# Patient Record
Sex: Female | Born: 1945 | Race: White | Hispanic: No | Marital: Single | Smoking: Former smoker
Health system: Southern US, Community
[De-identification: ages and names within clinical notes are randomized; demographics above are authoritative.]

## PROBLEM LIST (undated history)

## (undated) DIAGNOSIS — I2510 Atherosclerotic heart disease of native coronary artery without angina pectoris: Secondary | ICD-10-CM

## (undated) DIAGNOSIS — F419 Anxiety disorder, unspecified: Secondary | ICD-10-CM

## (undated) DIAGNOSIS — I739 Peripheral vascular disease, unspecified: Secondary | ICD-10-CM

## (undated) DIAGNOSIS — N200 Calculus of kidney: Secondary | ICD-10-CM

## (undated) DIAGNOSIS — F411 Generalized anxiety disorder: Secondary | ICD-10-CM

## (undated) DIAGNOSIS — E785 Hyperlipidemia, unspecified: Secondary | ICD-10-CM

## (undated) DIAGNOSIS — I10 Essential (primary) hypertension: Secondary | ICD-10-CM

## (undated) HISTORY — DX: Hyperlipidemia, unspecified: E78.5

## (undated) HISTORY — DX: Peripheral vascular disease, unspecified: I73.9

## (undated) HISTORY — DX: Atherosclerotic heart disease of native coronary artery without angina pectoris: I25.10

## (undated) HISTORY — PX: CORONARY ARTERY BYPASS GRAFT: SHX141

## (undated) HISTORY — DX: Calculus of kidney: N20.0

## (undated) HISTORY — DX: Generalized anxiety disorder: F41.1

---

## 2009-05-04 ENCOUNTER — Inpatient Hospital Stay: Payer: Self-pay | Admitting: Internal Medicine

## 2010-05-02 ENCOUNTER — Ambulatory Visit: Payer: Self-pay | Admitting: Internal Medicine

## 2010-05-13 ENCOUNTER — Ambulatory Visit: Payer: Self-pay | Admitting: Internal Medicine

## 2010-06-17 ENCOUNTER — Ambulatory Visit
Admission: RE | Admit: 2010-06-17 | Discharge: 2010-06-17 | Payer: Self-pay | Source: Home / Self Care | Attending: Family Medicine | Admitting: Family Medicine

## 2010-06-17 DIAGNOSIS — I10 Essential (primary) hypertension: Secondary | ICD-10-CM | POA: Insufficient documentation

## 2010-06-17 DIAGNOSIS — I2510 Atherosclerotic heart disease of native coronary artery without angina pectoris: Secondary | ICD-10-CM | POA: Insufficient documentation

## 2010-06-17 LAB — CONVERTED CEMR LAB
Ketones, urine, test strip: NEGATIVE
Protein, U semiquant: NEGATIVE
Specific Gravity, Urine: 1.015
Urobilinogen, UA: 0.2
pH: 5.5

## 2010-06-18 ENCOUNTER — Encounter: Payer: Self-pay | Admitting: Family Medicine

## 2010-06-28 ENCOUNTER — Ambulatory Visit: Payer: Self-pay | Admitting: Unknown Physician Specialty

## 2010-07-04 NOTE — Assessment & Plan Note (Signed)
Summary: back pain/jbb   Vital Signs:  Patient profile:   65 year old female Height:      64.25 inches Weight:      227 pounds BMI:     38.80 O2 Sat:      99 % on Room air Temp:     97.1 degrees F oral Pulse rate:   58 / minute Pulse rhythm:   regular Resp:     18 per minute BP sitting:   185 / 84  (right arm)  Vitals Entered By: Levonne Spiller EMT-P (June 17, 2010 4:16 PM)  O2 Flow:  Room air CC: Lower Back Pain Is Patient Diabetic? No Pain Assessment Patient in pain? yes     Location: lower back Intensity: 5 Type: aching Onset of pain  Gradual  Does patient need assistance? Functional Status Self care Ambulation Normal   Chief Complaint:  Lower Back Pain.  History of Present Illness: The patient presented today because for the past 3 days she has been having some lower back pain especially with bending over and picking up articles of clothing, etc.  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  Pt says she has not lost control of her bowel or bladder function.  She says that she is not having blood in urine or dysuria.  She says that she has been feeling a tightness and aching sensation in the muscles of the lumbar spine.  Pt says that she has not taken any medications at this time.  Pt says she has been working and in pain all day and this is the reason her BP has been elevated.  Pt says that she had her BP tested several days ago and it was 130/60.  PT denies headache, CP, SOB, nausea, weakness, etc.   Allergies (verified): No Known Drug Allergies  Past History:  Past Medical History: HTN CAD s/p CABG HLE  Past Surgical History: CABG x4  Family History: Denies significant per pt.   Social History: Pt works in [employer] full time.  No ETOH, tobacco or recreational drug use. Helping to raise great grandchild.   Review of Systems General:  Denies chills, fatigue, fever, loss of appetite, malaise, sleep disorder, sweats,  weakness, and weight loss. Eyes:  Denies blurring, discharge, double vision, eye irritation, eye pain, halos, itching, light sensitivity, red eye, vision loss-1 eye, and vision loss-both eyes. ENT:  Denies decreased hearing, difficulty swallowing, ear discharge, earache, hoarseness, nasal congestion, nosebleeds, postnasal drainage, ringing in ears, sinus pressure, and sore throat. CV:  Denies bluish discoloration of lips or nails, chest pain or discomfort, difficulty breathing at night, difficulty breathing while lying down, fainting, fatigue, leg cramps with exertion, lightheadness, near fainting, palpitations, shortness of breath with exertion, swelling of feet, swelling of hands, and weight gain. Resp:  Denies chest discomfort, chest pain with inspiration, cough, coughing up blood, excessive snoring, hypersomnolence, morning headaches, pleuritic, shortness of breath, sputum productive, and wheezing. GI:  Denies abdominal pain, bloody stools, change in bowel habits, constipation, dark tarry stools, diarrhea, excessive appetite, gas, hemorrhoids, indigestion, loss of appetite, nausea, vomiting, vomiting blood, and yellowish skin color. GU:  Denies abnormal vaginal bleeding, decreased libido, discharge, dysuria, genital sores, hematuria, incontinence, nocturia, urinary frequency, and urinary hesitancy. MS:  Complains of low back pain, muscle aches, and stiffness; Lower Back Pain, No injury Noted.. Derm:  Denies changes in color of skin, changes in nail beds, dryness, excessive perspiration, flushing, hair  loss, insect bite(s), itching, lesion(s), poor wound healing, and rash. Neuro:  Denies brief paralysis, difficulty with concentration, disturbances in coordination, falling down, headaches, inability to speak, memory loss, numbness, poor balance, seizures, sensation of room spinning, tingling, tremors, visual disturbances, and weakness. Psych:  Denies alternate hallucination ( auditory/visual), anxiety,  depression, easily angered, easily tearful, irritability, mental problems, panic attacks, sense of great danger, suicidal thoughts/plans, thoughts of violence, unusual visions or sounds, and thoughts /plans of harming others. Endo:  Denies cold intolerance, excessive hunger, excessive thirst, excessive urination, heat intolerance, polyuria, and weight change. Heme:  Denies abnormal bruising, bleeding, enlarge lymph nodes, fevers, pallor, and skin discoloration. Allergy:  Denies hives or rash, itching eyes, persistent infections, seasonal allergies, and sneezing.  Physical Exam  General:  Well-developed,well-nourished,in no acute distress; alert,appropriate and cooperative throughout examination Head:  Normocephalic and atraumatic without obvious abnormalities. No apparent alopecia or balding. Eyes:  No corneal or conjunctival inflammation noted. EOMI. Perrla. Funduscopic exam benign, without hemorrhages, exudates or papilledema. Vision grossly normal. Ears:  External ear exam shows no significant lesions or deformities.  Otoscopic examination reveals clear canals, tympanic membranes are intact bilaterally without bulging, retraction, inflammation or discharge. Hearing is grossly normal bilaterally. Nose:  External nasal examination shows no deformity or inflammation. Nasal mucosa are pink and moist without lesions or exudates. Mouth:  Oral mucosa and oropharynx without lesions or exudates.  Teeth in good repair. Lungs:  Normal respiratory effort, chest expands symmetrically. Lungs are clear to auscultation, no crackles or wheezes. Heart:  Normal rate and regular rhythm. S1 and S2 normal without gallop, murmur, click, rub or other extra sounds. Abdomen:  Bowel sounds positive,abdomen soft and non-tender without masses, organomegaly or hernias noted. Msk:  tenderness in the paraspinal muscles of the lumbar spine.  Negative straight leg raise.  Normal reflexes in the lower extremities.  No strength or  motion deficits noted.  NO herpetic rash seen.   Extremities:  No clubbing, cyanosis, edema, or deformity noted with normal full range of motion of all joints.   Neurologic:  No cranial nerve deficits noted. Station and gait are normal. Plantar reflexes are down-going bilaterally. DTRs are symmetrical throughout. Sensory, motor and coordinative functions appear intact.   Impression & Recommendations:  Problem # 1:  LOW BACK PAIN, ACUTE (ICD-724.2)  Her updated medication list for this problem includes:    Cyclobenzaprine Hcl 5 Mg Tabs (Cyclobenzaprine hcl) .Marland Kitchen... Take 1 by mouth every 8 hours as needed for muscle spasms in back:  caution will cause drowsiness    Lortab 5-500 Mg Tabs (Hydrocodone-acetaminophen) .Marland Kitchen... Take 1 by mouth every 6 hours as needed severe back pain:  will cause drowsiness. Toradol 30 mg IM given in office today.  The risks, benefits and possible side effects were clearly explained and discussed with the patient.  The patient verbalized clear understanding.  The patient was given instructions to return if symptoms don't improve, worsen or new changes develop.  If it is not during clinic hours and the patient cannot get back to this clinic then the patient was told to seek medical care at an available urgent care or emergency department.  The patient verbalized understanding.   If no improvement in next 48 hours patient was instructed to call and we will send her for another xray of L-Spine. The patient verbalized clear understanding.   Pt was given instructions to go to ER if symptoms worsen or don't improve.  The patient verbalized clear understanding.    Orders:  Ketorolac-Toradol 15mg  ([id number])  Problem # 2:  UNSPECIFIED ESSENTIAL HYPERTENSION (ICD-401.9)  Her updated medication list for this problem includes:    Metoprolol Tartrate 25 Mg Tabs (Metoprolol tartrate) .Marland Kitchen... 1/2 tablet twice a day by mouth Rechecked BP in office and improved to 160/85.  Pt given instructions  to have BP rechecked again in 2-3 days and to follow up with her PCP to have her BP meds adjusted if not controlling her BP. The patient verbalized clear understanding.    Complete Medication List: 1)  Metoprolol Tartrate 25 Mg Tabs (Metoprolol tartrate) .... 1/2 tablet twice a day by mouth 2)  Lipitor 20 Mg Tabs (Atorvastatin calcium) .Marland Kitchen.. 1 tablet at bedtime by mouth 3)  Zithromax Z-pak 250 Mg Tabs (Azithromycin) .... 2 by mouth then 1 by mouth qd 4)  Cyclobenzaprine Hcl 5 Mg Tabs (Cyclobenzaprine hcl) .... Take 1 by mouth every 8 hours as needed for muscle spasms in back:  caution will cause drowsiness 5)  Lortab 5-500 Mg Tabs (Hydrocodone-acetaminophen) .... Take 1 by mouth every 6 hours as needed severe back pain:  will cause drowsiness.  Patient Instructions: 1)  Go to the pharmacy and pick up your prescription (s).  It may take up to 30 mins for electronic prescriptions to be delivered to the pharmacy.  Please call if your pharmacy has not received your prescriptions after 30 minutes.   2)  Check your Blood Pressure regularly. If it is above: 140/90 you should make an appointment. 3)  Muscle Relaxers like cyclobenzaprine can cause drowsiness.  Please be careful. 4)  Get your blood pressure rechecked again in 2-3 days. 5)  See your PCP in 1 week to have your blood pressure rechecked and have your meds adjusted if needed.  6)  The patient was informed that there is no on-call provider or services available at this clinic during off-hours (when the clinic is closed).  If the patient developed a problem or concern that required immediate attention, the patient was advised to go the the nearest available urgent care or emergency department for medical care.  The patient verbalized understanding.    7)  The patient's prescriptions were checked for possible interactions and electronically sent to the pharmacy of choice.   Prescriptions: LORTAB 5-500 MG TABS (HYDROCODONE-ACETAMINOPHEN) take 1 by  mouth every 6 hours as needed severe back pain:  Will Cause Drowsiness.  #12 x 0   Entered and Authorized by:   Standley Dakins MD   Signed by:   Standley Dakins MD on 06/17/2010   Method used:   Print then Give to Patient   RxID:   [id number] CYCLOBENZAPRINE HCL 5 MG TABS (CYCLOBENZAPRINE HCL) take 1 by mouth every 8 hours as needed for muscle spasms in back:  Caution Will Cause Drowsiness  #12 x 0   Entered and Authorized by:   Standley Dakins MD   Signed by:   Standley Dakins MD on 06/17/2010   Method used:   Electronically to        [facility] [phone]* (retail)       [address] [place], [place]  [address]       Ph: [phone] or [phone]       Fax: [phone]   RxID:   [id number]    Medication Administration  Injection # 1:    Medication: Ketorolac-Toradol 30mg     Diagnosis: Lower Back Pain  Route: IM    Site: LUOQ gluteus    Exp Date: 05/03/2011    Lot #: [id number]    Patient tolerated injection without complications    Given by: Levonne Spiller EMT-P (June 17, 2010 4:47 PM)  Orders Added: 1)  Ketorolac-Toradol 15mg  [[id number]]  Lab Results    Ordered by:  Dr. Maryln Manuel    Date tests performed: 06/17/2010    Performed by:  Levonne Spiller EMT-P Urinalysis:      Color:     Yellow    Appear:     Clear    Leuk:     Neg    Nitr:     Neg    Urobil:     0.2    Prot:     Neg    pH:     5.5    Blood:     Neg    Sp. Gr:     1.015    Ket:     Neg    Bili:     Neg    Glu:     Neg

## 2010-07-04 NOTE — Assessment & Plan Note (Signed)
Summary: HOARSE AND CHEST CONGESTION/EVM   Vital Signs:  Patient Profile:   65 Years Old Female CC:      Cold & URI symptoms Height:     64.25 inches Weight:      223 pounds BMI:     38.12 O2 Sat:      96 % O2 treatment:    Room Air Temp:     97.3 degrees F oral Pulse rate:   76 / minute Pulse rhythm:   regular Resp:     18 per minute BP sitting:   188 / 86  (right arm)  Pt. in pain?   no  Vitals Entered By: Levonne Spiller EMT-P (May 13, 2010 4:17 PM)              Is Patient Diabetic? No     Serial Vital Signs/Assessments:  Time      Position  BP       Pulse  Resp  Temp     By 1655                129/88                         Ysidro Evert MD   Updated Prior Medication List: * METOPROLOL 1half tab twice per day * LIPITOR 1 tab per day  Current Allergies: No known allergies History of Present Illness Chief Complaint: Cold & URI symptoms x 2 weeks. History of Present Illness: chest tighter but hoarseness better. overall not better or worse. cough is dry, nasal  d/c is clear. no fever, just tired. "sinus" seems to be seasonal.  No over the counter meds   REVIEW OF SYSTEMS Constitutional Symptoms       Complains of fatigue.     Denies fever, chills, night sweats, weight loss, and weight gain.  Eyes       Denies change in vision, eye pain, eye discharge, glasses, contact lenses, and eye surgery. Ear/Nose/Throat/Mouth       Complains of frequent runny nose, sinus problems, sore throat, and hoarseness.      Denies hearing loss/aids, change in hearing, ear pain, ear discharge, dizziness, frequent nose bleeds, and tooth pain or bleeding.      Comments: st gone Respiratory       Complains of dry cough and shortness of breath.      Denies wheezing, asthma, bronchitis, and emphysema/COPD.      Comments: sob is really need to sigh Cardiovascular       Denies murmurs, chest pain, and tires easily with exhertion.    Gastrointestinal       Denies stomach pain,  nausea/vomiting, diarrhea, constipation, blood in bowel movements, and indigestion. Genitourniary       Denies painful urination, kidney stones, and loss of urinary control. Neurological       Denies paralysis, seizures, and fainting/blackouts. Musculoskeletal       Denies muscle pain, joint pain, joint stiffness, decreased range of motion, redness, swelling, muscle weakness, and gout.  Skin       Denies bruising, unusual mles/lumps or sores, and hair/skin or nail changes.  Psych       Denies mood changes, temper/anger issues, anxiety/stress, speech problems, depression, and sleep problems. Physical Exam General appearance: well developed, well nourished, no acute distress, mod hoarse Head: normocephalic, atraumatic Eyes: conjunctivae and lids normal Ears: normal, no lesions or deformities Nasal: mucosa pink, nonedematous, no septal deviation, turbinates normal  Oral/Pharynx: tongue normal, posterior pharynx without erythema or exudate Neck: neck supple,  trachea midline, no masses Chest/Lungs: no rales, wheezes, or rhonchi bilateral, breath sounds equal without effort. midline scar Neurological: grossly intact and non-focal Skin: no obvious rashes or lesions MSE: oriented to time, place, and person Assessment New Problems: UPPER RESPIRATORY INFECTION, ACUTE (ICD-465.9)   Plan New Medications/Changes: ZITHROMAX Z-PAK 250 MG TABS (AZITHROMYCIN) 2 by mouth then 1 by mouth qd  #6 x 0, 05/13/2010, J. Juline Patch MD   The patient and/or caregiver has been counseled thoroughly with regard to medications prescribed including dosage, schedule, interactions, rationale for use, and possible side effects and they verbalize understanding.  Diagnoses and expected course of recovery discussed and will return if not improved as expected or if the condition worsens. Patient and/or caregiver verbalized understanding.  Prescriptions: ZITHROMAX Z-PAK 250 MG TABS (AZITHROMYCIN) 2 by mouth then 1 by  mouth qd  #6 x 0   Entered and Authorized by:   J. Juline Patch MD   Signed by:   Shela Commons. Juline Patch MD on 05/13/2010   Method used:   Print then Give to Patient   RxID:   [id number]   Patient Instructions: 1)  start z pak if worse. 2)  Oral Rehydration Solution: drink 1/2 ounce every 15 minutes. If tolerated afert 1 hour, drink 1 ounce every 15 minutes. As you can tolerate, keep adding 1/2 ounce every 15 minutes, up to a total of 2-4 ounces. Contact the office if unable to tolerate oral solution, if you keep vomiting, or you continue to have signs of dehydration. 3)  own tussionex cough syrup if cough at night. 4)  vit c. 5)  claritin 10 daily 6)  blood pressure checks, see MD if more than 170/110.   7)  Please schedule an appointment with your primary doctor in :  Appended Document: Med/Alg Import     Preload Clinical Lists Medications added:  METOPROLOL TARTRATE 25 MG TABS (METOPROLOL TARTRATE) 1/2 tablet twice a day by mouth LIPITOR 20 MG TABS (ATORVASTATIN CALCIUM) 1 tablet at bedtime by mouth

## 2010-07-05 ENCOUNTER — Ambulatory Visit: Payer: Self-pay | Admitting: Unknown Physician Specialty

## 2011-04-15 ENCOUNTER — Ambulatory Visit: Payer: Self-pay | Admitting: Vascular Surgery

## 2011-04-23 ENCOUNTER — Ambulatory Visit: Payer: Self-pay | Admitting: Vascular Surgery

## 2011-04-30 ENCOUNTER — Ambulatory Visit: Payer: Self-pay | Admitting: Cardiology

## 2011-05-05 ENCOUNTER — Ambulatory Visit: Payer: Self-pay | Admitting: Vascular Surgery

## 2011-06-18 ENCOUNTER — Inpatient Hospital Stay: Payer: Self-pay | Admitting: Vascular Surgery

## 2011-06-19 LAB — CBC WITH DIFFERENTIAL/PLATELET
Basophil #: 0 10*3/uL (ref 0.0–0.1)
Eosinophil #: 0.1 10*3/uL (ref 0.0–0.7)
Lymphocyte #: 1.1 10*3/uL (ref 1.0–3.6)
MCH: 30.5 pg (ref 26.0–34.0)
MCHC: 33.3 g/dL (ref 32.0–36.0)
MCV: 92 fL (ref 80–100)
Monocyte #: 0.8 10*3/uL — ABNORMAL HIGH (ref 0.0–0.7)
Neutrophil %: 70.2 %
Platelet: 279 10*3/uL (ref 150–440)

## 2011-06-19 LAB — BASIC METABOLIC PANEL
Calcium, Total: 8.2 mg/dL — ABNORMAL LOW (ref 8.5–10.1)
Glucose: 106 mg/dL — ABNORMAL HIGH (ref 65–99)
Potassium: 4.4 mmol/L (ref 3.5–5.1)
Sodium: 145 mmol/L (ref 136–145)

## 2011-06-19 LAB — PROTIME-INR
INR: 1
Prothrombin Time: 13.1 secs (ref 11.5–14.7)

## 2012-03-30 ENCOUNTER — Ambulatory Visit: Payer: Self-pay | Admitting: Internal Medicine

## 2013-02-17 ENCOUNTER — Emergency Department: Payer: Self-pay | Admitting: Emergency Medicine

## 2013-02-17 LAB — URINALYSIS, COMPLETE
Blood: NEGATIVE
Glucose,UR: NEGATIVE mg/dL (ref 0–75)
Hyaline Cast: 8
Ketone: NEGATIVE
Nitrite: NEGATIVE
Protein: NEGATIVE
Specific Gravity: 1.017 (ref 1.003–1.030)
WBC UR: NONE SEEN /HPF (ref 0–5)

## 2013-09-09 DIAGNOSIS — E785 Hyperlipidemia, unspecified: Secondary | ICD-10-CM | POA: Insufficient documentation

## 2013-09-09 DIAGNOSIS — I10 Essential (primary) hypertension: Secondary | ICD-10-CM | POA: Insufficient documentation

## 2013-09-09 DIAGNOSIS — I25810 Atherosclerosis of coronary artery bypass graft(s) without angina pectoris: Secondary | ICD-10-CM | POA: Insufficient documentation

## 2013-11-16 DIAGNOSIS — I739 Peripheral vascular disease, unspecified: Secondary | ICD-10-CM | POA: Insufficient documentation

## 2013-11-29 DIAGNOSIS — F411 Generalized anxiety disorder: Secondary | ICD-10-CM | POA: Insufficient documentation

## 2014-03-03 ENCOUNTER — Inpatient Hospital Stay: Payer: Self-pay | Admitting: Internal Medicine

## 2014-03-03 LAB — BASIC METABOLIC PANEL
Anion Gap: 8 (ref 7–16)
BUN: 12 mg/dL (ref 7–18)
CALCIUM: 8.7 mg/dL (ref 8.5–10.1)
CHLORIDE: 109 mmol/L — AB (ref 98–107)
Co2: 25 mmol/L (ref 21–32)
Creatinine: 0.81 mg/dL (ref 0.60–1.30)
EGFR (African American): >60
GLUCOSE: 92 mg/dL (ref 65–99)
Osmolality: 283 (ref 275–301)
Potassium: 3.9 mmol/L (ref 3.5–5.1)
SODIUM: 142 mmol/L (ref 136–145)

## 2014-03-03 LAB — CK TOTAL AND CKMB (NOT AT ARMC)
CK, TOTAL: 122 U/L
CK, Total: 117 U/L
CK, Total: 103 U/L
CK-MB: 3.8 ng/mL — AB (ref 0.5–3.6)
CK-MB: 4.2 ng/mL — ABNORMAL HIGH (ref 0.5–3.6)
CK-MB: 3.4 ng/mL (ref 0.5–3.6)

## 2014-03-03 LAB — PRO B NATRIURETIC PEPTIDE: B-Type Natriuretic Peptide: 4287 pg/mL — ABNORMAL HIGH (ref 0–125)

## 2014-03-03 LAB — CBC
HCT: 38.5 % (ref 35.0–47.0)
HGB: 12.5 g/dL (ref 12.0–16.0)
MCH: 31.4 pg (ref 26.0–34.0)
MCHC: 32.4 g/dL (ref 32.0–36.0)
MCV: 97 fL (ref 80–100)
PLATELETS: 279 10*3/uL (ref 150–440)
RBC: 3.98 10*6/uL (ref 3.80–5.20)
RDW: 13.3 % (ref 11.5–14.5)
WBC: 6.5 10*3/uL (ref 3.6–11.0)

## 2014-03-03 LAB — TROPONIN I
Troponin-I: <0.02 ng/mL
Troponin-I: 0.02 ng/mL
Troponin-I: 0.02 ng/mL

## 2014-03-04 LAB — CBC WITH DIFFERENTIAL/PLATELET
BASOS PCT: 1.1 %
Basophil #: 0.1 10*3/uL (ref 0.0–0.1)
EOS ABS: 0.2 10*3/uL (ref 0.0–0.7)
EOS PCT: 3.3 %
HCT: 42 % (ref 35.0–47.0)
HGB: 13.6 g/dL (ref 12.0–16.0)
LYMPHS PCT: 25.2 %
Lymphocyte #: 1.3 10*3/uL (ref 1.0–3.6)
MCH: 31.2 pg (ref 26.0–34.0)
MCHC: 32.4 g/dL (ref 32.0–36.0)
MCV: 97 fL (ref 80–100)
MONOS PCT: 15.8 %
Monocyte #: 0.8 x10 3/mm (ref 0.2–0.9)
NEUTROS ABS: 2.7 10*3/uL (ref 1.4–6.5)
Neutrophil %: 54.6 %
Platelet: 259 10*3/uL (ref 150–440)
RBC: 4.35 10*6/uL (ref 3.80–5.20)
RDW: 13.3 % (ref 11.5–14.5)
WBC: 5 10*3/uL (ref 3.6–11.0)

## 2014-03-04 LAB — LIPID PANEL
Cholesterol: 223 mg/dL — ABNORMAL HIGH (ref 0–200)
HDL: 49 mg/dL (ref 40–60)
LDL CHOLESTEROL, CALC: 141 mg/dL — AB (ref 0–100)
TRIGLYCERIDES: 163 mg/dL (ref 0–200)
VLDL Cholesterol, Calc: 33 mg/dL (ref 5–40)

## 2014-03-04 LAB — PRO B NATRIURETIC PEPTIDE: B-TYPE NATIURETIC PEPTID: 3170 pg/mL — AB (ref 0–125)

## 2014-03-04 LAB — TSH: THYROID STIMULATING HORM: 1.55 u[IU]/mL

## 2014-05-26 LAB — TROPONIN I: Troponin-I: <0.02 ng/mL

## 2014-05-26 LAB — CBC
HCT: 42.1 % (ref 35.0–47.0)
HGB: 13.8 g/dL (ref 12.0–16.0)
MCH: 31.6 pg (ref 26.0–34.0)
MCHC: 32.8 g/dL (ref 32.0–36.0)
MCV: 96 fL (ref 80–100)
PLATELETS: 334 10*3/uL (ref 150–440)
RBC: 4.37 10*6/uL (ref 3.80–5.20)
RDW: 13.2 % (ref 11.5–14.5)
WBC: 10.4 10*3/uL (ref 3.6–11.0)

## 2014-05-26 LAB — PRO B NATRIURETIC PEPTIDE: B-TYPE NATIURETIC PEPTID: 6827 pg/mL — AB (ref 0–125)

## 2014-05-26 LAB — COMPREHENSIVE METABOLIC PANEL
ANION GAP: 7 (ref 7–16)
Albumin: 3.9 g/dL (ref 3.4–5.0)
Alkaline Phosphatase: 165 U/L — ABNORMAL HIGH
BILIRUBIN TOTAL: 0.4 mg/dL (ref 0.2–1.0)
BUN: 19 mg/dL — ABNORMAL HIGH (ref 7–18)
Calcium, Total: 8.7 mg/dL (ref 8.5–10.1)
Chloride: 105 mmol/L (ref 98–107)
Co2: 26 mmol/L (ref 21–32)
Creatinine: 1.17 mg/dL (ref 0.60–1.30)
EGFR (African American): 59 — ABNORMAL LOW
GFR CALC NON AF AMER: 49 — AB
GLUCOSE: 228 mg/dL — AB (ref 65–99)
Osmolality: 285 (ref 275–301)
Potassium: 3.7 mmol/L (ref 3.5–5.1)
SGOT(AST): 84 U/L — ABNORMAL HIGH (ref 15–37)
SGPT (ALT): 73 U/L — ABNORMAL HIGH
SODIUM: 138 mmol/L (ref 136–145)
Total Protein: 7.1 g/dL (ref 6.4–8.2)

## 2014-05-27 ENCOUNTER — Inpatient Hospital Stay: Payer: Self-pay | Admitting: Internal Medicine

## 2014-05-27 LAB — CK-MB
CK-MB: 4.6 ng/mL — ABNORMAL HIGH (ref 0.5–3.6)
CK-MB: 4.6 ng/mL — AB (ref 0.5–3.6)
CK-MB: 4.1 ng/mL — AB (ref 0.5–3.6)

## 2014-05-27 LAB — TROPONIN I
TROPONIN-I: 0.23 ng/mL — AB
Troponin-I: 0.34 ng/mL — ABNORMAL HIGH

## 2014-05-27 LAB — PROTIME-INR
INR: 1
Prothrombin Time: 13.3 secs (ref 11.5–14.7)

## 2014-05-27 LAB — APTT: Activated PTT: 25.7 secs (ref 23.6–35.9)

## 2014-05-27 LAB — POTASSIUM: POTASSIUM: 3.6 mmol/L (ref 3.5–5.1)

## 2014-05-28 LAB — BASIC METABOLIC PANEL
ANION GAP: 6 — AB (ref 7–16)
BUN: 20 mg/dL — ABNORMAL HIGH (ref 7–18)
CALCIUM: 9.1 mg/dL (ref 8.5–10.1)
CHLORIDE: 99 mmol/L (ref 98–107)
CREATININE: 1 mg/dL (ref 0.60–1.30)
Co2: 35 mmol/L — ABNORMAL HIGH (ref 21–32)
GFR CALC NON AF AMER: 58 — AB
GLUCOSE: 105 mg/dL — AB (ref 65–99)
OSMOLALITY: 282 (ref 275–301)
POTASSIUM: 3.8 mmol/L (ref 3.5–5.1)
Sodium: 140 mmol/L (ref 136–145)

## 2014-06-13 DIAGNOSIS — E780 Pure hypercholesterolemia: Secondary | ICD-10-CM | POA: Diagnosis not present

## 2014-06-13 DIAGNOSIS — F419 Anxiety disorder, unspecified: Secondary | ICD-10-CM | POA: Diagnosis not present

## 2014-06-13 DIAGNOSIS — Z79899 Other long term (current) drug therapy: Secondary | ICD-10-CM | POA: Diagnosis not present

## 2014-06-13 DIAGNOSIS — I2510 Atherosclerotic heart disease of native coronary artery without angina pectoris: Secondary | ICD-10-CM | POA: Diagnosis not present

## 2014-06-13 DIAGNOSIS — I10 Essential (primary) hypertension: Secondary | ICD-10-CM | POA: Diagnosis not present

## 2014-09-04 DIAGNOSIS — I2510 Atherosclerotic heart disease of native coronary artery without angina pectoris: Secondary | ICD-10-CM | POA: Diagnosis not present

## 2014-09-04 DIAGNOSIS — I25810 Atherosclerosis of coronary artery bypass graft(s) without angina pectoris: Secondary | ICD-10-CM | POA: Diagnosis not present

## 2014-09-04 DIAGNOSIS — I739 Peripheral vascular disease, unspecified: Secondary | ICD-10-CM | POA: Diagnosis not present

## 2014-09-04 DIAGNOSIS — I10 Essential (primary) hypertension: Secondary | ICD-10-CM | POA: Diagnosis not present

## 2014-09-23 NOTE — Consult Note (Signed)
PATIENT NAME:  Molly Esparza, Molly Esparza MR#:  [id number] DATE OF BIRTH:  May 22, 1946  DATE OF CONSULTATION:  03/03/2014  REFERRING PHYSICIAN:   CONSULTING PHYSICIAN:  Cesare Sumlin D. Juliann Pares, MD  PRIMARY CARE PHYSICIAN: Kandyce Rud, MD.   CARDIOLOGIST: Darlin Priestly. Fath, MD.   INDICATION: Shortness of breath, chest pressure, and malignant hypertension   HISTORY OF PRESENT ILLNESS: The patient is a 69 year old white female with past history of coronary artery disease, 4 vessel coronary bypass years ago. The patient has been experiencing 3 days of worsening shortness of breath, dyspnea with lower extremity edema. She has not had any significant cardiac workup since her bypass. She has had persistent shortness of breath during the course of the day. She had her blood pressure checked and it was significantly elevated, so she was brought to the Emergency Room for evaluation. The patient states that her blood pressure is about 200 systolic. Chest x-ray showed some evidence of heart failure and pulmonary congestion. She was given Lasix and potassium in the ER, hydralazine for her blood pressure, and nitroglycerin and had some improvement in her symptoms. She complained of chest pressure mid sternal, not quite as bad as when she had her bypass but the pain and discomfort were very similar. Denied any blackout spells, no syncope. Pain did not get better until she came to the Emergency Room. Her shortness of breath did not get better until she came to the Emergency Room .   PAST MEDICAL HISTORY: Coronary artery disease, hypertension, obesity, anxiety, hyperlipidemia.   PAST SURGICAL HISTORY: Coronary artery bypass surgery.   SOCIAL HISTORY: Lives alone. Quit smoking. No alcohol consumption. Still works at a [employer].   FAMILY HISTORY: Lymphoma, heart disease, diabetes.   MEDICATIONS: Vitamin D 600 once a day, multivitamin once a day, metoprolol 25 one-half tablet twice a day, iron 24 mg 1 tablet daily, Lasix 40 mg  once a day, fish oil 1200 mg twice a day, Colace 100 mg twice a day, Lipitor 20 mg once a day. She is on calcium, as well as vitamin D.   REVIEW OF SYSTEMS:  Denies blackout spells. No syncope. No nausea, vomiting. Denies fever, chills, sweats. Denies weight loss or weight gain. No hemoptysis or hematemesis.  Denies bright-red blood per rectum. No polyuria or polydipsia. Has a history of anemia, on iron. No recent urinary symptoms. No rashes. Denies any vertigo or syncope. Has a history of anxiety that is reasonably stable.   PHYSICAL EXAMINATION:  VITAL SIGNS: Initial blood pressure was 200. After treatment and medication it improved. Temperature is 98, pulse 75, 98% on 2 liters.  HEENT: Normocephalic, atraumatic. Pupils equally reactive to light.  NECK: Supple. No significant JVD or bruits.  LUNGS: Clear with mild rales in the bases.  HEART: Regular rate and rhythm. Positive S2. Positive S3. Systolic ejection murmur in the left sternal border. PMI nondisplaced.  ABDOMEN: Benign.  EXTREMITIES: Trace edema.  NEUROLOGIC: Intact.  SKIN: Normal.   LABORATORIES: Chest x-ray shows pulmonary vascular congestion, cardiomegaly. EKG normal sinus rhythm, LVH, nonspecific findings. Troponin less than 0.02. BNP 4300. Creatinine was normal. Sodium normal. Potassium was normal. Chloride was 109. CBC normal.   ASSESSMENT:  1.  Malignant hypertension.  2.  Unstable angina.  3.  Congestive heart failure.  4.  Mild obesity.  5.  Hyperlipidemia.  6.  Coronary artery disease.  7.  Shortness of breath.   PLAN: Agree with admit. Rule out for myocardial infarction. We will follow up cardiac enzymes,  repeat EKG. Place him on telemetry. Continue diuretics for diuresis for congestive heart failure. Continue hypertension control with beta blocker. Hydralazine will be helpful. Consider ACE inhibitor therapy transfusion. Heart failure. An echocardiogram will help assess LV function as well as wall motion. Aspirin  therapy. Continue statin therapy for hyperlipidemia. Anticoagulation now until we are sure she rules out for myocardial infarction. Can consider long-term aspirin. Not sure whether nitroglycerin will be necessary or not. When she is diuresed she may need inhalers. Would recommend reflux prophylaxis with omeprazole, also may consider DVT prophylaxis. Continue supplemental oxygen until she completely improves. Will base further results in a followup note. Will perform an echocardiogram. Will consider whether cardiac catheter is indicated for unstable anginal symptoms.    ____________________________ Bobbie Stackwayne D. Juliann Paresallwood, MD ddc:at D: 03/04/2014 13:26:00 ET T: 03/04/2014 18:01:45 ET JOB#: [id number]  cc: Fredna Stricker D. Juliann Paresallwood, MD, <Dictator> Alwyn PeaWAYNE D Akiera Allbaugh MD ELECTRONICALLY SIGNED 04/05/2014 10:30

## 2014-09-23 NOTE — Discharge Summary (Signed)
Dates of Admission and Diagnosis:  Date of Admission 27-May-2014   Date of Discharge 28-May-2014   Admitting Diagnosis Acute Diastolic Heart failure.   Final Diagnosis Acute Diastolic Heart failure. Hypertension hyperlipidmeia    Chief Complaint/History of Present Illness A 69 year old Caucasian female with a past medical history significant for hypertension, coronary artery disease status post CABG, history of hyperlipidemia, history of congestive heart failure secondary due to diastolic dysfunction with a normal EF per echocardiogram done in October 2015, presents to the Emergency Room via EMS with the complaints of increased shortness of breath which started yesterday morning. The patient stated that she is experiencing increasing shortness of breath, which gradually worsened. Hence, she called EMS, and EMS found the patient with a respiratory distress with hypoxia of 82% of O2 saturation. Hence, was put on BiPAP and brought to the Emergency Room for further evaluation. The patient denies any chest pain. No dizziness, loss of consciousness. No palpitations. She notes that she has on and off swelling of the bilateral legs. She denies missing any medications. Also denies noncompliance to any diet. In the Emergency Room, the patient was evaluated by the ED physician and was found to have pulmonary vascular congestion, was given IV Lasix following which she started feeling better. The patient is receiving oxygen through nasal cannula and currently has shortness of breath that is under control, and she denies any chest pain, shortness of breath, palpitations, dizziness. No history of any nausea, vomiting, diarrhea, abdominal pain. No dysuria, frequency, or urgency. The patient had similar episodes in October 2015 for which she was hospitalized, and echocardiogram done at that time revealed a normal ejection fraction.   Allergies:  Amoxicillin: Chest Pain  Tape: Other  Hepatic:  25-Dec-15 22:12    Bilirubin, Total 0.4  Alkaline Phosphatase  165 (46-116 NOTE: New Reference Range 12/20/13)  SGPT (ALT)  73 (14-63 NOTE: New Reference Range 12/20/13)  SGOT (AST)  84  Total Protein, Serum 7.1  Albumin, Serum 3.9  Cardiology:  26-Dec-15 09:15   Echo Doppler REASON FOR EXAM:     COMMENTS:     PROCEDURE: [facility] - ECHO DOPPLER COMPLETE(TRANSTHOR)  - May 27 2014  9:15AM   RESULT: Echocardiogram Report  Patient Name:   Molly Esparza Date of Exam: 05/27/2014 Medical Rec #:  [id number]         Custom1: Dateof Birth:  1946/05/08      Height:       64.0 in Patient Age:    31 years       Weight:       213.0 lb Patient Gender: F              BSA:          2.01 m??  Indications: MI Sonographer:    Janalee Dane RCS Referring Phys: Azucena Freed, N  Summary:  1. Left ventricular ejection fraction, by visual estimation, is 45 to  50%.  2. Normal global left ventricular systolic function.  3. Mildly dilated left atrium.  4. Mildly dilated right atrium.  5. Mild to moderate mitral valve regurgitation.  6. Mildly increased left ventricular internal cavity size.  7. Mild to moderate tricuspid regurgitation. 2D AND M-MODE MEASUREMENTS (normal ranges within parentheses): Left Ventricle:          Normal IVSd (2D):      1.14 cm (0.7-1.1) LVPWd (2D):     1.02 cm (0.7-1.1) Aorta/LA:  Normal LVIDd (2D):     5.23 cm (3.4-5.7) Aortic Root (2D): 2.90 cm (2.4-3.7) LVIDs (2D):     3.32 cm           Left Atrium (2D): 4.40 cm (1.9-4.0) LV FS (2D):     36.5 %   (>25%) LV EF (2D):     65.9 %   (>50%)                                   Right Ventricle:                                   RVd (2D):        0.01 cm LV DIASTOLIC FUNCTION: MV Peak E: 0.94 m/s E/e' Ratio: 11.70 MV Peak A: 0.77 m/s Decel Time: 310 msec E/A Ratio: 1.22 SPECTRAL DOPPLER ANALYSIS (where applicable): Mitral Valve: MV P1/2 Time: 89.90 msec MV Area, PHT: 2.45 cm?? Tricuspid Valve and PA/RV Systolic Pressure: TR  Max Velocity: 2.43 m/s RA   Pressure: 5 mmHg RVSP/PASP: 28.6 mmHg  PHYSICIAN INTERPRETATION: Left Ventricle: The left ventricular internal cavity size was mildly  increased. LV posterior wall thickness was normal. Global LV systolic  function was normal. Left ventricular ejection fraction, by visual  estimation, is 45 to 50%. Right Ventricle: Normal right ventricular size, wall thickness, and  systolic function. RV wall thickness is normal. Left Atrium: The left atrium is mildly dilated. Right Atrium: The right atrium is mildly dilated. Pericardium: There is no evidence of pericardial effusion. Mitral Valve: Mild to moderate mitral valve regurgitation is seen. Tricuspid Valve: Mild to moderate tricuspid regurgitation is visualized.  The tricuspid regurgitant velocity is 2.43 m/s, and with an assumed right   atrial pressure of 5 mmHg, the estimated right ventricular systolic  pressure is normal at 28.6 mmHg. Aortic Valve: The aortic valve is trileaflet and structurally normal,  with normal leaflet excursion; without any evidence of aortic stenosis or  insufficiency. Pulmonic Valve: Structurally normal pulmonic valve, with normal leaflet  excursion. Aorta: The aortic root and ascending aorta are structurally normal, with  no evidence of dilitation.  [id number] Serafina Royals MD Electronically signed by [id number] Serafina Royals MD Signature Date/Time: 05/28/2014/8:47:23 AM  *** Final *** IMPRESSION: .    Verified By: Corey Skains  ([facility]), M.D., MD  Routine Chem:  25-Dec-15 22:12   Glucose, Serum  228  BUN  19  Creatinine (comp) 1.17  Sodium, Serum 138  Potassium, Serum 3.7  Chloride, Serum 105  CO2, Serum 26  Calcium (Total), Serum 8.7  Anion Gap 7  Osmolality (calc) 285  eGFR (African American)  59  eGFR (Non-African American)  49 (eGFR values <3m/min/1.73 m2 may be an indication of chronic kidney disease (CKD). Calculated eGFR, using the MRDR Study equation, is  useful in  patients with stable renal function. The eGFR calculation will not be reliable in acutely ill patients when serum creatinine is changing rapidly. It is not useful in patients on dialysis. The eGFR calculation may not be applicable to patients at the low and high extremes of body sizes, pregnant women, and vegetarians.)  B-Type Natriuretic Peptide ([facility]  6827 (Result(s) reported on 26 May 2014 at 11:05PM.)  Cardiac:  25-Dec-15 22:12   Troponin I < 0.02 (0.00-0.05 0.05 ng/mL or less: NEGATIVE  Repeat testing in 3-6 hrs  if clinically indicated. >0.05 ng/mL: POTENTIAL  MYOCARDIAL INJURY. Repeat  testing in 3-6 hrs if  clinically indicated. NOTE: An increase or decrease  of 30% or more on serial  testing suggests a  clinically important change)  Routine Hem:  25-Dec-15 22:12   WBC (CBC) 10.4  RBC (CBC) 4.37  Hemoglobin (CBC) 13.8  Hematocrit (CBC) 42.1  Platelet Count (CBC) 334 (Result(s) reported on 26 May 2014 at 10:24PM.)  MCV 96  MCH 31.6  MCHC 32.8  RDW 13.2   Pertinent Past History:  Pertinent Past History 1.  Hypertension.  2.  Myocardial infarction status post CABG.  3.  Hyperlipidemia.  4.  History of congestive heart failure with diastolic dysfunction with a normal ejection fraction on echocardiogram done in October 2015.   Hospital Course:  Hospital Course 1.  Acute shortness of breath secondary due to acute on chronic diastolic congestive heart failure.      IV lasix for diuresis, checked Potassium due to leg cramps.- normal.     Appreciated cardio consult. Echo done- EF 45-50%.    tapered oxygen to room air.    She did not know about fluid restriction in CHF- i educated her and she is very thankful for that.     Was taking lasix 20 mg BID- now with fluid restriction- will give only 20 Daily.     She have appointment with Dr. Ubaldo Glassing in next 2 weeks.  2.  History of hypertension, betablocker, lisinopril, BP slightly high- added amlodipin.  3.   History of coronary artery disease/myocardial infarction status post coronary artery bypass graft.     Troponin slightly high due to demand ischemia- no further work ups as per cardio.     Continue cardio meds.  4.  History of hyperlipidemia, stable on statin. Continue same.   5.  Deep vein thrombosis prophylaxis with subcutaneous Lovenox. discharge home today.   Condition on Discharge Stable   DISCHARGE INSTRUCTIONS HOME MEDS:  Medication Reconciliation: Patient's Home Medications at Discharge:     Medication Instructions  aspirin 81 mg oral tablet  1 tab(s) orally once a day (in the morning)   multivitamin  1 tab(s) orally once a day (in the morning)   fish oil 1200 mg oral capsule  1 cap(s) orally 2 times a day   chondroitin-glucosamine+d3 1000 iu  1 tab(s) orally once a day (in the morning)   vitamin b6 100 mg oral tablet  1 tab(s) orally once a day (in the morning)   iron 28 mg  1 tab(s) orally once a day (in the morning)   calcium, magnesium and zinc  1 tab(s) orally once a day (in the morning)   docusate sodium 100 mg oral tablet  1 tab(s) orally once a day, As Needed   atorvastatin 20 mg oral tablet  1 tab(s) orally once a day (in the morning)   furosemide 20 mg oral tablet  1 tab(s) orally once a day   lisinopril 20 mg oral tablet  1 tab(s) orally once a day   metoprolol tartrate 25 mg oral tablet  1 tab(s) orally 2 times a day   citalopram 20 mg oral tablet  1 tab(s) orally once a day   amlodipine 10 mg oral tablet  1 tab(s) orally once a day     Physician's Instructions:  Diet Low Sodium  Low Fat, Low Cholesterol   Activity Limitations As tolerated   Return to Work Not Applicable   Time frame for Follow  Up Appointment 1-2 weeks  Dr. Ubaldo Glassing   TIME SPENT:  Total Time: Greater than 30 minutes   Electronic Signatures: Vaughan Basta (MD)  (Signed 30-Dec-15 08:22)  Authored: ADMISSION DATE AND DIAGNOSIS, CHIEF COMPLAINT/HPI, Allergies, PERTINENT LABS,  PERTINENT PAST HISTORY, HOSPITAL COURSE, DISCHARGE INSTRUCTIONS HOME MEDS, PATIENT INSTRUCTIONS, TIME SPENT   Last Updated: 30-Dec-15 08:22 by Vaughan Basta (MD)

## 2014-09-23 NOTE — H&P (Signed)
PATIENT NAME:  Molly Esparza, Molly Esparza MR#:  [id number] DATE OF BIRTH:  [birth year]  DATE OF ADMISSION:  05/27/2014  REFERRING DOCTOR: Gladstone Pih, MD  PRIMARY CARE DOCTOR: Kandyce Rud, MD  PRIMARY CARDIOLOGIST: Darlin Priestly. Fath, MD  CHIEF COMPLAINT: Increased shortness of breath since this morning.    HISTORY OF PRESENT ILLNESS: A 69 year old Caucasian female with a past medical history significant for hypertension, coronary artery disease status post CABG, history of hyperlipidemia, history of congestive heart failure secondary due to diastolic dysfunction with a normal EF per echocardiogram done in October 2015, presents to the Emergency Room via EMS with the complaints of increased shortness of breath which started yesterday morning. The patient stated that she is experiencing increasing shortness of breath, which gradually worsened. Hence, she called EMS, and EMS found the patient with a respiratory distress with hypoxia of 82% of O2 saturation. Hence, was put on BiPAP and brought to the Emergency Room for further evaluation. The patient denies any chest pain. No dizziness, loss of consciousness. No palpitations. She notes that she has on and off swelling of the bilateral legs. She denies missing any medications. Also denies noncompliance to any diet. In the Emergency Room, the patient was evaluated by the ED physician and was found to have pulmonary vascular congestion, was given IV Lasix following which she started feeling better. The patient is receiving oxygen through nasal cannula and currently has shortness of breath that is under control, and she denies any chest pain, shortness of breath, palpitations, dizziness. No history of any nausea, vomiting, diarrhea, abdominal pain. No dysuria, frequency, or urgency. The patient had similar episodes in October 2015 for which she was hospitalized, and echocardiogram done at that time revealed a normal ejection fraction.   PAST MEDICAL HISTORY: 1.   Hypertension.  2.  Myocardial infarction status post CABG.  3.  Hyperlipidemia.  4.  History of congestive heart failure with diastolic dysfunction with a normal ejection fraction on echocardiogram done in October 2015.  PAST SURGICAL HISTORY: Status post CABG.   ALLERGIES: 1.  AMOXICILLIN. 2.  TAPE.    HOME MEDICATIONS: 1.  Aspirin 81 mg tablet 1 tablet orally once a day.  2.  Atorvastatin 20 mg tablet 1 tablet orally once a day.  3.  Calcium, magnesium, and zinc tablet 1 tablet orally once a day.  4.  Glucosamine chondroitin tablet 1 tablet orally once a day.  5.  Docusate sodium 100 mg tablet 1 tablet orally once a day as needed. 6.  Fish oil capsule 1200 mg 1 capsule orally 2 times a day.  7.  Furosemide 20 mg tablet 1 tablet orally once a day.  8.  Iron 1 tablet orally once a day. 9.  Lisinopril 20 mg tablet 1 tablet orally once a day.  10.  Metoprolol tartrate 25 mg tablet 1 tablet orally twice a day.  11.  Multivitamin tablet 1 tablet orally once a day.  12.  Vitamin B6, 100 mg tablet orally 1 tablet orally once a day.  SOCIAL HISTORY: She is single, lives alone. She works at the [employer], and no history of any smoking. Denies any substance abuse. She does take alcohol infrequently on social basis.   FAMILY HISTORY: Mom with lymphoma; father, lung cancer and heart disease. Diabetes mellitus runs in the family.  REVIEW OF SYSTEMS: CONSTITUTIONAL: Negative for fever, fatigue, generalized weakness. No increased weight gain or weight loss.  EYES: Negative for blurred vision, double vision. No pain. No  redness. No inflammation.  ENT: Negative for tinnitus, ear pain, hearing loss, epistaxis, nasal discharge, difficulty swallowing.  RESPIRATORY: Negative for cough or wheezing. She does have dyspnea, which gradually worsened. No hemoptysis. No painful respirations.  CARDIOVASCULAR: Negative for chest pain. She does have dyspnea on increasing exertion. Pedal edema on and off  present. No palpitations. No syncope noted. No dizziness.  GASTROINTESTINAL: Negative for nausea, vomiting, diarrhea, abdominal pain, hematemesis, GERD symptoms, rectal bleeding.  GENITOURINARY: Negative for dysuria, hematuria, frequency, or urgency.  ENDOCRINE: Negative for polyuria or nocturia. No heat or cold intolerance.  HEMATOLOGIC: Negative for anemia, easy bruising or bleeding. INTEGUMENTARY: Negative for acne, skin rash, lesions.  MUSCULOSKELETAL: Negative for any arthritis, joint swelling, or gout.  NEUROLOGICAL: Negative for focal weakness, numbness. No history of CVA, TIA, or seizure disorder.  PSYCHIATRIC: Negative for anxiety, insomnia, and depression.  PHYSICAL EXAMINATION:  VITAL SIGNS: Temperature 97.8 degrees Fahrenheit, pulse rate is 71 per minute, respiration 20, blood pressure 130/179 on arrival. Currently, blood pressure is 188/88. Pulse oximetry 98% on oxygen supplementation with 2 L nasal cannula.  GENERAL: Well-nourished, well-developed lady, alert, awake, and oriented, pleasant and cooperative, in no acute distress at this time.  HEAD: Atraumatic, normocephalic.  EYES: PERRLA. Pupils equal, react to light and accommodation. No conjunctival pallor.  Extraocular movements intact.  NOSE: No nasal lesions. No drainage. EARS: No drainage. No external lesions.  ORAL CAVITY: No mucosal congestion,  No exudates.  NECK: Supple. No JVD. No thyromegaly. No carotid bruit. Range of motion of neck normal.  RESPIRATORY: Good respiratory effort. Not using accessory muscles of respiration. Bilateral vesicular breath sounds present. Bilateral basal rales on both sides present. No rhonchi present.  CARDIOVASCULAR: S1, S2 regular. No murmurs appreciated. No clicks, no rubs. Peripheral pulses equal at carotid, femoral, and pedal pulses. Trace pedal edema bilaterally present.  GASTROINTESTINAL: Abdomen soft, obese, nontender. No hepatosplenomegaly. No guarding. No rigidity. No tenderness.  Bowel sounds present and equal in all 4 quadrants.  GENITOURINARY: Deferred.  MUSCULOSKELETAL: Range of motion adequate in all areas. Strength and tone equal bilaterally in both upper and lower extremities.  SKIN: Inspection within normal limits.  LYMPHATIC: No cervical lymphadenopathy.  VASCULAR: Good dorsalis pedis and posterior tibial pulses.  NEUROLOGIC: Alert, awake, and oriented x 3. Cranial nerves II through XII grossly intact. DTRs 2+, bilateral and symmetrical. Motor 5/5 strength in both upper and lower extremities.  PSYCHIATRIC: Judgment and insight adequate. Alert and oriented x 3. Memory and mood intact, within normal limits.   LABORATORY DATA: BNP 6827, serum glucose 228, BUN 19, creatinine 1.17, serum sodium 138, potassium 3.7, bicarbonate 26, total calcium 8.7, total protein 7.9,  total bilirubin 0.4, alkaline phosphatase 165, AST 84, ALT 73. Troponin less than 0.02. WBC is 10.4, hemoglobin 13.8, hematocrit 42.1, platelet count 334,000.   IMAGING STUDIES:  Chest x-ray: Vascular congestion, mild cardiomegaly noted. Increased interstitial markings with concern for mild interstitial edema.   EKG: Normal sinus rhythm with ventricular rate of 71 beats per minute. Poor R-wave progression. Otherwise no acute ST-T changes.   ASSESSMENT AND PLAN: A 69 year old Caucasian female with a past medical history of hypertension, myocardial infarction, status post coronary artery bypass graft, history of diastolic congestive heart failure with a normal echocardiogram done in October 2015, history of hyperlipidemia, presents to the Emergency Room with complaints of shortness of breath which gradually worsened since yesterday. Associated with some mild pedal edema.  1.  Acute shortness of breath secondary due to acute on chronic  diastolic congestive heart failure. Rule out acute coronary event. Plan: Admit to telemetry. Continue intravenous furosemide b.i.d. Continue beta blocker, aspirin, statin, ACE  inhibitor. Cycle cardiac enzymes. On oxygen supplementation. Follow up oxygen saturations.   2.  History of hypertension, stable on home medications. Continue same.  3.  History of coronary artery disease/myocardial infarction status post coronary artery bypass graft. The patient is stable. Troponin x 1 negative. Plan: Admit to telemetry. Cycle cardiac enzymes. Continue home medications.  4.  History of hyperlipidemia, stable on statin. Continue same.  5.  Deep vein thrombosis prophylaxis with subcutaneous Lovenox.  6.  Gastrointestinal prophylaxis with Protonix.   CODE STATUS: Full code.   TIME SPENT: 55 minutes.   ____________________________ Crissie FiguresEdavally N. Amenah Tucci, MD enr:ST D: 05/27/2014 01:06:19 ET T: 05/27/2014 03:10:30 ET JOB#: [id number]  cc: Crissie FiguresEdavally N. Georgia Baria, MD, <Dictator> Kandyce RudMarcus Babaoff, MD Darlin PriestlyKenneth A. Lady GaryFath, MD Crissie FiguresEDAVALLY N Javed Cotto MD ELECTRONICALLY SIGNED 05/28/2014 20:34

## 2014-09-23 NOTE — H&P (Signed)
PATIENT NAME:  Molly Esparza, Molly Esparza MR#:  [id number] DATE OF BIRTH:  07/14/1945  DATE OF ADMISSION:  03/03/2014  PRIMARY CARE PHYSICIAN: Kandyce Rud, MD  PRIMARY CARDIOLOGIST: Darlin Priestly. Lady Gary, MD  REFERRING EMERGENCY ROOM PHYSICIAN: Eryka A. Inocencio Homes, MD  CHIEF COMPLAINT: Shortness of breath and chest pressure.   HISTORY OF PRESENT ILLNESS: The patient is a 69 year old Caucasian female with a past medical history of coronary artery disease, status post a 4-vessel CABG several years ago. She has been experiencing a 3 day history of worsening of shortness of breath associated with lower extremity edema and a 1 day history of chest pressure today. The patient is reporting that her shortness of breath is worse today, associated with some cough which is dry in nature, but she managed to go to work. At work, she was not feeling right and she was still feeling short of breath. She went to closest pharmacy; there her blood pressure was extremely high. The pharmacist asked her not to drive and go to the ED. The patient called her sister and came into the ED immediately.   The patient's blood pressure in the ED was 197/106, but subsequently it went up to 220/76. Chest x-ray has revealed congestive heart failure with pulmonary interstitial edema. The patient was diagnosed with new onset CHF and 20 mg of IV potassium was given in the ED. The patient also has received hydralazine 5 mg IV x1 as her blood pressure is elevated. One inch of nitro paste is attached to the anterior chest wall, and subsequently blood pressure started slowly trending down. During my examination, the patient's chest pressure is resolved. She is urinating and her shortness of breath is better. Her sister is at bedside. No other complaints.   PAST MEDICAL HISTORY: Coronary artery disease, status post 4-vessel coronary artery bypass grafting, hypertension and anxiety.   PAST SURGICAL HISTORY: Coronary artery bypass grafting, 4 vessel surgery.    ALLERGIES: AMOXICILLIN.   PSYCHOSOCIAL HISTORY: Lives at home. Lives alone. She used to smoke, but quit smoking several years ago. Denies alcohol or illicit drug usage.   FAMILY HISTORY: Mother has a history of lymphoma. Father has lung cancer and heart disease. Diabetes mellitus runs in her family.   HOME MEDICATIONS: Vitamin B 600 mg p.o. once daily, multivitamin 1 tablet p.o. once daily, metoprolol tartrate 25 mg 1/2 tablet p.o. 2 times a day, iron 28 mg 1 tablet p.o. once daily, furosemide 40 mg 1 tablet p.o. once daily, fish oil 1200 mg 1 capsule p.o. 2 times a day, Colace 100 mg 1 tablet p.o. once daily as needed, chondroitin sulfate glucosamine with D3 1000 international units 1 tablet once daily, calcium/magnesium/zinc 1 tablet p.o. once daily, atorvastatin 20 mg p.o. once daily, aspirin 81 mg once daily.   REVIEW OF SYSTEMS:  CONSTITUTIONAL: Denies any fever, fatigue or weakness.  EYES: Denies blurry vision, double vision, glaucoma.  ENT: Denies epistaxis, tinnitus, discharge, snoring.  RESPIRATORY: Complaining of dry cough. Denies any wheezing. No history of COPD.  CARDIOVASCULAR: Complaining of chest pressure, which is a middle of the chest and not radiating, associated with shortness of breath. Denies any palpitations or syncope.  GASTROINTESTINAL: Denies nausea, vomiting, diarrhea, abdominal pain, hematemesis or melena.  GENITOURINARY: No dysuria or hematuria. She is having urinary frequency after Lasix IV was given.  GYNECOLOGIC AND BREAST: Denies breast mass or vaginal discharge.  ENDOCRINE: Denies polyuria, nocturia, thyroid problems.  HEMATOLOGIC AND LYMPHATIC: No anemia, easy bruising, bleeding.  INTEGUMENTARY: No acne,  rash, lesions.  MUSCULOSKELETAL: No joint pain in the neck and back. Denies any gout.  NEUROLOGIC: Denies vertigo, ataxia, dementia.  PSYCHIATRIC: Has a chronic history of anxiety. No ADD or OCD.   PHYSICAL EXAMINATION:  VITAL SIGNS: Temperature 98.2,  pulse is 75, respirations 20, blood pressure 238/106, pulse oximetry is 98% on 2 L.  GENERAL APPEARANCE: Not in acute distress. Moderately built and nourished.  HEENT: Normocephalic, atraumatic. Pupils are equal, reacting to light and accommodation. No scleral icterus. No conjunctival injection. No sinus tenderness. No postnasal drip. Moist mucous membranes.  NECK: Supple. No JVD. No thyromegaly. Range of motion is intact.  LUNGS: Rales and rhonchi are present bilaterally and diffusely.  CARDIAC: S1, S2 normal. Regular rate and rhythm. No murmurs.  ABDOMEN: Soft. Bowel sounds are positive in all 4 quadrants. Obese, nontender, nondistended. No hepatosplenomegaly. No masses felt.  NEUROLOGIC: Awake, alert and oriented x 3. Cranial nerves II through XII are grossly intact. Motor and sensory are intact. Reflexes are 2+.  EXTREMITIES: There is 1+ edema noted. No cyanosis. No clubbing. SKIN: Normal turgor. No rashes. No lesions.  MUSCULOSKELETAL: No joint effusion, tenderness, erythema.  PSYCHIATRIC: Normal mood and affect.   LABORATORIES AND IMAGING STUDIES: Chest x-ray, PA and lateral views: Congestive heart failure with pulmonary interstitial edema, prior CABG. A 12-lead EKG: Normal sinus rhythm with left atrial hypertrophy. No acute ST-T wave changes. Troponin less than 0.02. BNP is 4287, BUN and creatinine are normal. Sodium is normal. Potassium is normal. Chloride 109. Anion gap, serum osmolality and calcium are normal. CBC is normal.   ASSESSMENT AND PLAN: A 69 year old Caucasian female presenting to the ED with a chief complaint of shortness of breath for the past 3 days, which is getting worse progressively. Today with chest pressure and lower extremity edema, more on the right than on the left, and with very high blood pressure. Will be admitted with the following assessment and management.  1.  Acute respiratory distress secondary to new-onset congestive heart failure with interstitial pulmonary  edema, most likely from malignant hypertension. As this is new onset, ejection fraction is unknown at this time. We will admit her to telemetry. We will provide her Lasix 40 mg IV every 12 hours. Lasix 20 mg IV was given in the ED. We will increase the beta blocker to 25 mg p.o. b.i.d., continue aspirin 81 mg p.o. once daily, and continue atorvastatin 20 mg p.o. at bedtime, which is her home medication.  2.  We will obtain a 2-D echocardiogram to evaluate valvular abnormalities and ejection fraction. Cardiology consult is placed to Dr. Lady GaryFath. We will cycle cardiac biomarkers. We will check morning labs.  3.  Daily weights and strict I's and O's. The patient will be benefited with outpatient CHF Clinic.  4.  Malignant hypertension. We will increase the metoprolol dose to 25 mg p.o. b.i.d. Nitro paste was attached to the anterior chest wall. Lasix will be given, 40 mg IV q. 12 hours. We will provide IV Lopressor on an as-needed basis for systolic blood pressure greater than 150.  5.  Chest pressure with new onset congestive heart failure. We will rule out acute myocardial infarction and cycle cardiac biomarkers.  6.  History of coronary artery disease, status post 4-vessel coronary artery bypass grafting. The patient will be baby aspirin, nitroglycerin, beta blocker and statin, cycling cardiac biomarkers in view of chest pressure, which is currently resolved.  We will provide gastrointestinal prophylaxis with Pepcid, and deep venous thrombosis  prophylaxis  with Lovenox subcutaneous.  CODE STATUS: The patient is full code. Her sister is the medical power of attorney.   The diagnosis and plan of care were discussed in detail with the patient and her sister at bedside. They both verbalized understanding of the plan.   TOTAL TIME SPENT ON THE ADMISSION: 50 minutes    ____________________________ Ramonita Lab, MD ag:MT D: 03/03/2014 12:23:16 ET T: 03/03/2014 13:13:40 ET JOB#: [id number]  cc: Ramonita Lab,  MD, <Dictator> Kandyce Rud, MD Ramonita Lab MD ELECTRONICALLY SIGNED 03/11/2014 14:39

## 2014-09-23 NOTE — Discharge Summary (Signed)
PATIENT NAME:  [name], Molly R MR#:  [id number] DATE OF BIRTH:  1945/08/07  DATE OF ADMISSION:  03/03/2014 DATE OF DISCHARGE:  03/05/2014  ADDENDUM: The patient was actually discharged on 03/05/2014. She was kept in the hospital as we were awaiting for echocardiogram results. No new changes to her medications. She will need close followup with her primary care physician for elevation in blood pressure.    ____________________________ Larrell Rapozo P. Juliene PinaMody, MD spm:TT D: 03/06/2014 13:42:44 ET T: 03/06/2014 15:28:57 ET JOB#: [id number]  cc: Tanis Hensarling P. Juliene PinaMody, MD, <Dictator> Eli Adami P Abisola Carrero MD ELECTRONICALLY SIGNED 03/06/2014 21:12
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
## 2014-09-23 NOTE — Consult Note (Signed)
Chief Complaint:  Subjective/Chief Complaint She   states to feel reasonably well today denies chest pain shortness of breath orweakness   VITAL SIGNS/ANCILLARY NOTES: **Vital Signs.:   03-Oct-15 11:55  Vital Signs Type Q 4hr  Temperature Temperature (F) 98.5  Celsius 36.9  Temperature Source oral  Pulse Pulse 55  Respirations Respirations 18  Systolic BP Systolic BP 128  Diastolic BP (mmHg) Diastolic BP (mmHg) 65  Mean BP 86  Pulse Ox % Pulse Ox % 94  Pulse Ox Activity Level  At rest  Oxygen Delivery Room Air/ 21 %  *Intake and Output.:   03-Oct-15 11:30  Grand Totals Intake:  240 Output:      Net:  240 24 Hr.:  -320  Oral Intake      In:  240  Percentage of Meal Eaten  100   Brief Assessment:  GEN well developed, well nourished, no acute distress   Cardiac Regular   Respiratory normal resp effort  clear BS   Gastrointestinal Normal   Gastrointestinal details normal Soft  Nontender  Nondistended   EXTR negative cyanosis/clubbing, negative edema   Lab Results: Thyroid:  03-Oct-15 05:06   Thyroid Stimulating Hormone 1.55 (0.45-4.50 (IU = International Unit)  ----------------------- Pregnant patients have  different reference  ranges for TSH:  - - - - - - - - - -  Pregnant, first trimetser:  0.36 - 2.50 uIU/mL)  LabObservation:  03-Oct-15 12:30   OBSERVATION Reason for Test  Cardiology:  03-Oct-15 12:30   Echo Doppler REASON FOR EXAM:     COMMENTS:     [facility])  - Mar 04 2014 12:30PM   RESULT: Echocardiogram Report  Patient Name:   Molly Esparza Date of Exam: 03/04/2014 Medical Rec #:  [id number]         Custom1: Date of Birth:  Jul 22, 1945      Height:       65.0 in Patient Age:    68 years       Weight:       215.0 lb Patient Gender: F              BSA:          2.04 m??  Indications: CHF Sonographer:    LTM Referring Phys: Ramonita LabGOURU, ARUNA  Summary:  1. Left ventricular ejection fraction, by visual  estimation, is 65 to  70%.  2. Normal global left ventricular systolic function.  3. Impaired relaxation pattern of LV diastolic filling.  4. Mildly increased left ventricular septal thickness.  5. Mildly dilated left atrium.  6. Mild mitral valve regurgitation.  7. Mildly increased left ventricular posterior wall thickness.  8. Mild tricuspid regurgitation. 2D AND M-MODE MEASUREMENTS (normal ranges within parentheses): Left Ventricle:          Normal   AoV Cusp Separation: 1.90 cm (1.5-2.6) IVSd (2D):      1.36 cm (0.7-1.1) LVPWd (2D):     1.26 cm (0.7-1.1) Aorta/LA:                  Normal LVIDd (2D):     4.55 cm (3.4-5.7) Aortic Root (2D): 2.50 cm (2.4-3.7) LVIDs (2D):     2.78 cm   AoV Cusp Exc:     1.90 cm (1.5-2.6) LV FS (2D):     38.9 %   (>25%)   Left Atrium (2D): 4.10 cm (1.9-4.0) LV EF (2D):     69.4 %   (>50%)  Right Ventricle:                                   RVd (2D): LV DIASTOLIC FUNCTION: MV Peak E: 1.61 m/s E/e' Ratio: 21.70 MV Peak A: 0.88 m/s Decel Time: 296 msec E/A Ratio: 0.86 SPECTRAL DOPPLER ANALYSIS (where applicable): Mitral Valve: MV P1/2 Time: 85.84 msec MV Area, PHT: 2.56 cm?? Aortic Valve: AoV Max Vel: 1.55 m/s AoV Peak PG: 9.6 mmHg AoV Mean PG: LVOT Vmax: 0.89 m/s LVOT VTI:  LVOT Diameter: 1.80 cm AoV Area, Vmax: 1.47 cm?? AoV Area, VTI:  AoV Area, Vmn: Tricuspid Valve and PA/RV Systolic Pressure: TR Max Velocity: 2.00 m/s RA  Pressure: 10 mmHg RVSP/PASP: 26.0 mmHg  PHYSICIAN INTERPRETATION: Left Ventricle: The left ventricular internal cavity size was normal. LV  septal wall thickness was mildly increased. LV posterior wall thickness  was mildly increased. Global LV systolic function was normal. Left  ventricular ejection fraction, by visual estimation, is 65 to 70%.  Spectral Doppler shows impaired relaxation pattern of LV diastolic  filling. Right Ventricle: The right ventricular size is normal. Global RV  systolic   function is normal. Left Atrium: The left atrium is mildly dilated. Right Atrium: The right atrium is normal in size. Pericardium: There is no evidence of pericardial effusion. Mitral Valve: The mitral valve is normal in structure. Mild mitral valve  regurgitation is seen. Tricuspid Valve: The tricuspid valve is normal. Mild tricuspid  regurgitation is visualized. The tricuspid regurgitant velocity is 2.00  m/s, and with an assumed right atrial pressure of 10 mmHg, the estimated  right ventricular systolic pressure is normal at 26.0 mmHg. Aortic Valve: The aortic valve is normal. No evidence of aortic valve  regurgitation is seen. Pulmonic Valve: The pulmonic valve is normal.  [id number] Dwayne Callwood MD Electronically signed by [id number] Dorothyann Peng MD Signature Date/Time: 03/05/2014/10:34:08 AM  *** Final ***  IMPRESSION: .    Verified By: Alwyn Pea, M.D., MD  Routine Chem:  03-Oct-15 05:06   B-Type Natriuretic Peptide [facility])  3170 (Result(s) reported on 04 Mar 2014 at 05:44AM.)  Cholesterol, Serum  223  Triglycerides, Serum 163  HDL (INHOUSE) 49  VLDL Cholesterol Calculated 33  LDL Cholesterol Calculated  141 (Result(s) reported on 04 Mar 2014 at [facility].)  Routine Hem:  03-Oct-15 05:06   WBC (CBC) 5.0  RBC (CBC) 4.35  Hemoglobin (CBC) 13.6  Hematocrit (CBC) 42.0  Platelet Count (CBC) 259  MCV 97  MCH 31.2  MCHC 32.4  RDW 13.3  Neutrophil % 54.6  Lymphocyte % 25.2  Monocyte % 15.8  Eosinophil % 3.3  Basophil % 1.1  Neutrophil # 2.7  Lymphocyte # 1.3  Monocyte # 0.8  Eosinophil # 0.2  Basophil # 0.1 (Result(s) reported on 04 Mar 2014 at 05:44AM.)   Radiology Results: XRay:    02-Oct-15 09:34, Chest PA and Lateral  Chest PA and Lateral   REASON FOR EXAM:    sob, cough  COMMENTS:       PROCEDURE: DXR - DXR CHEST PA (OR AP) AND LATERAL  - Mar 03 2014  9:34AM     CLINICAL DATA:  Chest pressure. Shortness of breath. Hypertension  prior  CABG.    EXAM:  CHEST  2 VIEW    COMPARISON:  02/17/2013.    FINDINGS:  Mediastinum and hilar structures are normal. Cardiomegaly. Prior  CABG. Pulmonary venous congestion diffuse pulmonary  interstitial  prominence noted consistent congestive heart failure from  interstitial edema. No significant pleural effusion or pneumothorax.  No acute bony abnormality.     IMPRESSION:  1. Congestive heart failure with pulmonary interstitial edema.  2. Prior CABG.      Electronically Signed    By: Maisie Fus  Register    On: 03/03/2014 09:45       Verified By: Gwynn Burly, M.D., MD  Cardiology:    02-Oct-15 08:40, ED ECG  Ventricular Rate 69  Atrial Rate 69  P-R Interval 164  QRS Duration 96  QT 414  QTc 443  P Axis 57  R Axis 47  T Axis 53  ECG interpretation   Normal sinus rhythm  Possible Left atrial enlargement  Nonspecific ST abnormality  Abnormal ECG  When compared with ECG of 23-Apr-2011 14:27,  Nonspecific T wave abnormality no longer evident in Inferior leads  T wave inversion no longer evident in Lateral leads  ----------unconfirmed----------  Confirmed by OVERREAD, NOT ([id number]), editor PEARSON, BARBARA ([id number]) on 03/06/2014 12:02:56 PM  ED ECG     03-Oct-15 12:30, Echo Doppler  Echo Doppler   REASON FOR EXAM:      COMMENTS:       PROCEDURE: ECH - ECHO DOPPLER COMPLETE(TRANSTHOR)  - Mar 04 2014 12:30PM     RESULT: Echocardiogram Report    Patient Name:   Molly Esparza Date of Exam: 03/04/2014  Medical Rec #:  [id number]         Custom1:  Date of Birth:  18-Jan-1946      Height:       65.0 in  Patient Age:    68 years       Weight:       215.0 lb  Patient Gender: F              BSA:          2.04 m??    Indications: CHF  Sonographer:    LTM  Referring Phys: Ramonita Lab    Summary:   1. Left ventricular ejection fraction, by visual estimation, is 65 to   70%.   2. Normal global left ventricular systolic function.   3. Impaired relaxation pattern of LV diastolic  filling.   4. Mildly increased left ventricular septal thickness.   5. Mildly dilated left atrium.   6. Mild mitral valve regurgitation.   7. Mildly increased left ventricular posterior wall thickness.   8. Mild tricuspid regurgitation.  2D AND M-MODE MEASUREMENTS (normal ranges within parentheses):  Left Ventricle:          Normal   AoV Cusp Separation: 1.90 cm (1.5-2.6)  IVSd (2D):      1.36 cm (0.7-1.1)  LVPWd (2D):     1.26 cm (0.7-1.1) Aorta/LA:                  Normal  LVIDd (2D):     4.55 cm (3.4-5.7) Aortic Root (2D): 2.50 cm (2.4-3.7)  LVIDs (2D):     2.78 cm   AoV Cusp Exc:     1.90 cm (1.5-2.6)  LV FS (2D):     38.9 %   (>25%)   Left Atrium (2D): 4.10 cm (1.9-4.0)  LV EF (2D):     69.4 %   (>50%)  Right Ventricle:                                    RVd (2D):  LV DIASTOLIC FUNCTION:  MV Peak E: 0.76 m/s E/e' Ratio: 21.70  MV Peak A: 0.88 m/s Decel Time: 296 msec  E/A Ratio: 0.86  SPECTRAL DOPPLER ANALYSIS (where applicable):  Mitral Valve:  MV P1/2 Time: 85.84 msec  MV Area, PHT: 2.56 cm??  Aortic Valve: AoV Max Vel: 1.55 m/s AoV Peak PG: 9.6 mmHg AoV Mean PG:  LVOT Vmax: 0.89 m/s LVOT VTI:  LVOT Diameter: 1.80 cm  AoV Area, Vmax: 1.47 cm?? AoV Area, VTI:  AoV Area, Vmn:  Tricuspid Valve and PA/RV Systolic Pressure: TR Max Velocity: 2.00 m/s RA   Pressure: 10 mmHg RVSP/PASP: 26.0 mmHg    PHYSICIAN INTERPRETATION:  Left Ventricle: The left ventricular internal cavity size was normal. LV   septal wall thickness was mildly increased. LV posterior wall thickness   was mildly increased. Global LV systolic function was normal. Left   ventricular ejection fraction, by visual estimation, is 65 to 70%.   Spectral Doppler shows impaired relaxation pattern of LV diastolic   filling.  Right Ventricle: The right ventricular size is normal. Global RV systolic     function is normal.  Left Atrium: The left atrium is mildly dilated.  Right  Atrium: The right atrium is normal in size.  Pericardium: There is no evidence of pericardial effusion.  Mitral Valve: The mitral valve is normal in structure. Mild mitral valve   regurgitation is seen.  Tricuspid Valve: The tricuspid valve is normal. Mild tricuspid   regurgitation is visualized. The tricuspid regurgitant velocity is 2.00   m/s, and with an assumed right atrial pressure of 10 mmHg, the estimated   right ventricular systolic pressure is normal at 26.0 mmHg.  Aortic Valve: The aortic valve is normal. No evidence of aortic valve   regurgitation is seen.  Pulmonic Valve: The pulmonic valve is normal.    1105 Dwayne Callwood MD  Electronically signed by [id number] Dorothyann Peng MD  Signature Date/Time: 03/05/2014/10:34:08 AM    *** Final ***    IMPRESSION: .        Verified By: Alwyn Pea, M.D., MD   Assessment/Plan:  Assessment/Plan:  Assessment IMP  coronary disease  coronary bypass  surgery  congestive heart failure  shortness of breath  malignant hypertension  obesity  coronary disease  hyperlipidemia  DJD .   Plan IMP  recommend medical therapy for now  will need further workup including possible cardiac catheterization  echocardiogram done appears to be reasonable with no significant gross abnormalities  continue diuretic therapy for mild heart failure  continue aggressive blood pressure control for malignant hypertension  recommend weight loss and exercise  continue statin therapy for hyperlipidemia  recommend beta-blockade therapy as well as ACE-inhibitor therapy  recommend cardiac catheterization or functional study  patient prefers to go directly to cardiac catheterization because of arthritic symptoms  continue pain medications for arthritis  have the patient follow-up with Dr. Lady Gary in 1-2 weeks to help ranged follow-up patient      cardiac catheterization   Electronic Signatures: Alwyn Pea (MD)  (Signed 09-Oct-15  14:14)  Authored: Chief Complaint, VITAL SIGNS/ANCILLARY NOTES, Brief Assessment, Lab Results, Radiology Results, Assessment/Plan   Last Updated: 09-Oct-15 14:14 by Dorothyann Peng D (MD)

## 2014-09-23 NOTE — Discharge Summary (Signed)
PATIENT NAME:  Molly PulseMONK, Shamyah R MR#:  [id number] DATE OF BIRTH:  March 25, 1946  DATE OF ADMISSION:  03/03/2014 DATE OF DISCHARGE:  03/05/2014  ADMISSION DIAGNOSIS:  Congestive heart failure, not otherwise specified, acute exacerbation.   DISCHARGE DIAGNOSES: 1.  Acute congestive heart failure exacerbation.  2.  Malignant hypertension.  3.  Hyperlipidemia.  4.  Elevated LDL and cholesterol.   CONSULTATIONS:  Cardiology.   LABORATORY DATA:  At discharge, white blood cells 5, hemoglobin 13.6, hematocrit 42, platelets of 259,000.  TSH 1.55. LDL is 141, cholesterol 223.   ECHO shows normal EF with diastolic dysfunction  HOSPITAL COURSE:  A 69 year old female with history of CAD and CABG who presented with shortness of breath, found to have CHF exacerbation. For further details, please refer to the H and P.  1.  Acutediastolic  CHF exacerbation:  The patient was diuresed with IV Lasix. She diuresed over 3 liters. She is not short of breath. Her lungs are clear to auscultation at discharge.  Her IV Lasix was changed to p.o. Lasix. I suspect her CHF exacerbation is fom uncontrooled hypertesion which we addressed while she ws in the hospital. Dr Juliann Paresallwood felt that at some point she may benefit fromcardiac cath but deferred this to Dr Lady GaryFath her cardiologist. She will follow up with Dr Lady GaryFath next  week adn they can decide if this would be in her best interest.  2.  Malignant hypertension:  We added ACE inhibitor to her regimen. She will need close follow outpatient followup for her malignant hypertension.  3.  Hyperlipidemia:  We encouraged the patient to have followup for her hyperlipidemia. She is already on atorvastatin.  4.  History of CAD and CABG:  The patient was continued on atorvastatin and metoprolol.  5.  GERD:  The patient is on ranitidine.   DISCHARGE MEDICATIONS:  1.  Aspirin 81 mg daily.  2.  Multivitamin 1 tablet daily.  3.  Fish oil 1200 b.i.d.  4.  Chondroitin glucosamine daily.  5.   Vitamin B6 at 100 mg daily.  6.  Iron 28 mg daily.  7.  Calcium, magnesium, and zinc daily.  8.  Docusate 100 mg p.r.n.  9.  Atorvastatin 20 mg in the morning.  10.  Metoprolol 25 b.i.d.  11.  Lasix 20 mg daily.  12.  Lisinopril 20 mg daily.   DISCHARGE DIET:  Low-sodium diet.   DISCHARGE ACTIVITY:  As tolerated.   DISCHARGE FOLLOWUP:  The patient needs to follow up with Dr. Lady GaryFath next week. We also made a referral to [facility].  TIME SPENT:  35 minutes.   ____________________________ Janyth ContesSital P. Juliene PinaMody, MD spm:ts D: 03/04/2014 12:15:00 ET T: 03/05/2014 02:47:03 ET JOB#: [id number]  cc: Reford Olliff P. Juliene PinaMody, MD, <Dictator> Darlin PriestlyKenneth A. Lady GaryFath, MD Janyth ContesSITAL P Heiley Shaikh MD ELECTRONICALLY SIGNED 03/05/2014 14:08

## 2014-09-23 NOTE — Consult Note (Signed)
Chief Complaint:  Subjective/Chief Complaint Patient states to be feeling well. patient denies any significant chest pain today is for shortness of breath.   VITAL SIGNS/ANCILLARY NOTES: **Vital Signs.:   04-Oct-15 09:35  Vital Signs Type Pre Medication  Pulse Pulse 68  Respirations Respirations 18  Systolic BP Systolic BP 110  Diastolic BP (mmHg) Diastolic BP (mmHg) 64  Mean BP 79  Pulse Ox % Pulse Ox % 95  Pulse Ox Activity Level  At rest  Oxygen Delivery Room Air/ 21 %  *Intake and Output.:   04-Oct-15 08:58  Grand Totals Intake:  240 Output:      Net:  240 24 Hr.:  240  Oral Intake      In:  240  Percentage of Meal Eaten  100   Brief Assessment:  GEN well developed, well nourished, no acute distress   Cardiac Regular   Respiratory normal resp effort  clear BS   Gastrointestinal Normal   Gastrointestinal details normal Soft  Nontender  Nondistended   EXTR negative cyanosis/clubbing, negative edema   Lab Results: Thyroid:  03-Oct-15 05:06   Thyroid Stimulating Hormone 1.55 (0.45-4.50 (IU = International Unit)  ----------------------- Pregnant patients have  different reference  ranges for TSH:  - - - - - - - - - -  Pregnant, first trimetser:  0.36 - 2.50 uIU/mL)  LabObservation:  03-Oct-15 12:30   OBSERVATION Reason for Test  Cardiology:  03-Oct-15 12:30   Echo Doppler REASON FOR EXAM:     COMMENTS:     [facility]  - Mar 04 2014 12:30PM   RESULT: Echocardiogram Report  Patient Name:   Molly Esparza Date of Exam: 03/04/2014 Medical Rec #:  [id number]         Custom1: Date of Birth:  1946/04/02      Height:       65.0 in Patient Age:    68 years       Weight:       215.0 lb Patient Gender: F              BSA:          2.04 m??  Indications: CHF Sonographer:    LTM Referring Phys: Ramonita LabGOURU, ARUNA  Summary:  1. Left ventricular ejection fraction, by visual estimation, is 65 to  70%.  2. Normal global left  ventricular systolic function.  3. Impaired relaxation pattern of LV diastolic filling.  4. Mildly increased left ventricular septal thickness.  5. Mildly dilated left atrium.  6. Mild mitral valve regurgitation.  7. Mildly increased left ventricular posterior wall thickness.  8. Mild tricuspid regurgitation. 2D AND M-MODE MEASUREMENTS (normal ranges within parentheses): Left Ventricle:          Normal   AoV Cusp Separation: 1.90 cm (1.5-2.6) IVSd (2D):      1.36 cm (0.7-1.1) LVPWd (2D):     1.26 cm (0.7-1.1) Aorta/LA:                  Normal LVIDd (2D):     4.55 cm (3.4-5.7) Aortic Root (2D): 2.50 cm (2.4-3.7) LVIDs (2D):     2.78 cm   AoV Cusp Exc:     1.90 cm (1.5-2.6) LV FS (2D):     38.9 %   (>25%)   Left Atrium (2D): 4.10 cm (1.9-4.0) LV EF (2D):     69.4 %   (>50%)  Right Ventricle:                                   RVd (2D): LV DIASTOLIC FUNCTION: MV Peak E: 1.61 m/s E/e' Ratio: 21.70 MV Peak A: 0.88 m/s Decel Time: 296 msec E/A Ratio: 0.86 SPECTRAL DOPPLER ANALYSIS (where applicable): Mitral Valve: MV P1/2 Time: 85.84 msec MV Area, PHT: 2.56 cm?? Aortic Valve: AoV Max Vel: 1.55 m/s AoV Peak PG: 9.6 mmHg AoV Mean PG: LVOT Vmax: 0.89 m/s LVOT VTI:  LVOT Diameter: 1.80 cm AoV Area, Vmax: 1.47 cm?? AoV Area, VTI:  AoV Area, Vmn: Tricuspid Valve and PA/RV Systolic Pressure: TR Max Velocity: 2.00 m/s RA  Pressure: 10 mmHg RVSP/PASP: 26.0 mmHg  PHYSICIAN INTERPRETATION: Left Ventricle: The left ventricular internal cavity size was normal. LV  septal wall thickness was mildly increased. LV posterior wall thickness  was mildly increased. Global LV systolic function was normal. Left  ventricular ejection fraction, by visual estimation, is 65 to 70%.  Spectral Doppler shows impaired relaxation pattern of LV diastolic  filling. Right Ventricle: The right ventricular size is normal. Global RV systolic   function is normal. Left Atrium: The left  atrium is mildly dilated. Right Atrium: The right atrium is normal in size. Pericardium: There is no evidence of pericardial effusion. Mitral Valve: The mitral valve is normal in structure. Mild mitral valve  regurgitation is seen. Tricuspid Valve: The tricuspid valve is normal. Mild tricuspid  regurgitation is visualized. The tricuspid regurgitant velocity is 2.00  m/s, and with an assumed right atrial pressure of 10 mmHg, the estimated  right ventricular systolic pressure is normal at 26.0 mmHg. Aortic Valve: The aortic valve is normal. No evidence of aortic valve  regurgitation is seen. Pulmonic Valve: The pulmonic valve is normal.  [id number] Mariaha Ellington MD Electronically signed by [id number] Dorothyann Peng MD Signature Date/Time: 03/05/2014/10:34:08 AM  *** Final ***  IMPRESSION: .    Verified By: Alwyn Pea, M.D., MD  Routine Chem:  03-Oct-15 05:06   B-Type Natriuretic Peptide [facility])  3170 (Result(s) reported on 04 Mar 2014 at 05:44AM.)  Cholesterol, Serum  223  Triglycerides, Serum 163  HDL (INHOUSE) 49  VLDL Cholesterol Calculated 33  LDL Cholesterol Calculated  141 (Result(s) reported on 04 Mar 2014 at [facility].)  Routine Hem:  03-Oct-15 05:06   WBC (CBC) 5.0  RBC (CBC) 4.35  Hemoglobin (CBC) 13.6  Hematocrit (CBC) 42.0  Platelet Count (CBC) 259  MCV 97  MCH 31.2  MCHC 32.4  RDW 13.3  Neutrophil % 54.6  Lymphocyte % 25.2  Monocyte % 15.8  Eosinophil % 3.3  Basophil % 1.1  Neutrophil # 2.7  Lymphocyte # 1.3  Monocyte # 0.8  Eosinophil # 0.2  Basophil # 0.1 (Result(s) reported on 04 Mar 2014 at 05:44AM.)   Radiology Results: XRay:    02-Oct-15 09:34, Chest PA and Lateral  Chest PA and Lateral   REASON FOR EXAM:    sob, cough  COMMENTS:       PROCEDURE: DXR - DXR CHEST PA (OR AP) AND LATERAL  - Mar 03 2014  9:34AM     CLINICAL DATA:  Chest pressure. Shortness of breath. Hypertension  prior CABG.    EXAM:  CHEST  2 VIEW    COMPARISON:   02/17/2013.    FINDINGS:  Mediastinum and hilar structures are normal. Cardiomegaly. Prior  CABG. Pulmonary venous congestion diffuse pulmonary  interstitial  prominence noted consistent congestive heart failure from  interstitial edema. No significant pleural effusion or pneumothorax.  No acute bony abnormality.     IMPRESSION:  1. Congestive heart failure with pulmonary interstitial edema.  2. Prior CABG.      Electronically Signed    By: Maisie Fus  Register    On: 03/03/2014 09:45       Verified By: Gwynn Burly, M.D., MD  Cardiology:    03-Oct-15 12:30, Echo Doppler  Echo Doppler   REASON FOR EXAM:      COMMENTS:       PROCEDURE: [facility] - ECHO DOPPLER COMPLETE(TRANSTHOR)  - Mar 04 2014 12:30PM     RESULT: Echocardiogram Report    Patient Name:   Molly Esparza Date of Exam: 03/04/2014  Medical Rec #:  [id number]         Custom1:  Date of Birth:  [birth year]      Height:       65.0 in  Patient Age:    68 years       Weight:       215.0 lb  Patient Gender: F              BSA:          2.04 m??    Indications: CHF  Sonographer:    LTM  Referring Phys: Ramonita Lab    Summary:   1. Left ventricular ejection fraction, by visual estimation, is 65 to   70%.   2. Normal global left ventricular systolic function.   3. Impaired relaxation pattern of LV diastolic filling.   4. Mildly increased left ventricular septal thickness.   5. Mildly dilated left atrium.   6. Mild mitral valve regurgitation.   7. Mildly increased left ventricular posterior wall thickness.   8. Mild tricuspid regurgitation.  2D AND M-MODE MEASUREMENTS (normal ranges within parentheses):  Left Ventricle:          Normal   AoV Cusp Separation: 1.90 cm (1.5-2.6)  IVSd (2D):      1.36 cm (0.7-1.1)  LVPWd (2D):     1.26 cm (0.7-1.1) Aorta/LA:                  Normal  LVIDd (2D):     4.55 cm (3.4-5.7) Aortic Root (2D): 2.50 cm (2.4-3.7)  LVIDs (2D):     2.78 cm   AoV Cusp Exc:     1.90 cm (1.5-2.6)  LV FS  (2D):     38.9 %   (>25%)   Left Atrium (2D): 4.10 cm (1.9-4.0)  LV EF (2D):     69.4 %   (>50%)                                      Right Ventricle:                                    RVd (2D):  LV DIASTOLIC FUNCTION:  MV Peak E: 0.76 m/s E/e' Ratio: 21.70  MV Peak A: 0.88 m/s Decel Time: 296 msec  E/A Ratio: 0.86  SPECTRAL DOPPLER ANALYSIS (where applicable):  Mitral Valve:  MV P1/2 Time: 85.84 msec  MV Area, PHT: 2.56 cm??  Aortic Valve: AoV Max Vel: 1.55 m/s AoV Peak PG: 9.6 mmHg AoV Mean PG:  LVOT Vmax: 0.89 m/s LVOT VTI:  LVOT Diameter: 1.80 cm  AoV Area, Vmax: 1.47 cm?? AoV Area, VTI:  AoV Area, Vmn:  Tricuspid Valve and PA/RV Systolic Pressure: TR Max Velocity: 2.00 m/s RA   Pressure: 10 mmHg RVSP/PASP: 26.0 mmHg    PHYSICIAN INTERPRETATION:  Left Ventricle: The left ventricular internal cavity size was normal. LV   septal wall thickness was mildly increased. LV posterior wall thickness   was mildly increased. Global LV systolic function was normal. Left   ventricular ejection fraction, by visual estimation, is 65 to 70%.   Spectral Doppler shows impaired relaxation pattern of LV diastolic   filling.  Right Ventricle: The right ventricular size is normal. Global RV systolic     function is normal.  Left Atrium: The left atrium is mildly dilated.  Right Atrium: The right atrium is normal in size.  Pericardium: There is no evidence of pericardial effusion.  Mitral Valve: The mitral valve is normal in structure. Mild mitral valve   regurgitation is seen.  Tricuspid Valve: The tricuspid valve is normal. Mild tricuspid   regurgitation is visualized. The tricuspid regurgitant velocity is 2.00   m/s, and with an assumed right atrial pressure of 10 mmHg, the estimated   right ventricular systolic pressure is normal at 26.0 mmHg.  Aortic Valve: The aortic valve is normal. No evidence of aortic valve   regurgitation is seen.  Pulmonic Valve: The pulmonic valve is  normal.    1105 Zareena Willis MD  Electronically signed by 1105 Dorothyann Peng MD  Signature Date/Time: 03/05/2014/10:34:08 AM    *** Final ***    IMPRESSION: .        Verified By: Alwyn Pea, M.D., MD   Assessment/Plan:  Assessment/Plan:  Assessment IMP  congestive heart failure  shortness of breath  malignant hypertension  obesity  shortness of breath  coronary disease  hyperlipidemia  DJD .   Plan IMP  patient appears to be improved and should be okay for discharge today  echocardiogram done appears to be reasonable with no significant gross abnormalities  continue diuretic therapy for mild heart failure  continue aggressive blood pressure control for malignant hypertension  recommend weight loss and exercise  continue statin therapy for hyperlipidemia  recommend beta-blockade therapy as well as ACE-inhibitor therapy  recommend cardiac catheterization or functional study  patient prefers to go directly to cardiac catheterization because of arthritic symptoms  continue pain medications for arthritis  have the patient follow-up with Dr. Lady Gary in 1-2 weeks to help ranged follow-up patient      cardiac catheterization   Electronic Signatures: Alwyn Pea (MD)  (Signed 04-Oct-15 13:14)  Authored: Chief Complaint, VITAL SIGNS/ANCILLARY NOTES, Brief Assessment, Lab Results, Radiology Results, Assessment/Plan   Last Updated: 04-Oct-15 13:14 by Dorothyann Peng D (MD)

## 2014-09-24 NOTE — Op Note (Signed)
PATIENT NAME:  [name], Jayle R MR#:  [id number] DATE OF BIRTH:  December 16, 1945  DATE OF PROCEDURE:  06/18/2011  PREOPERATIVE DIAGNOSES:  1. High-grade right carotid artery stenosis.  2. Coronary artery disease.  3. Subclavian artery stenosis status post intervention.   POSTOPERATIVE DIAGNOSES:  1. High-grade right carotid artery stenosis.  2. Coronary artery disease.  3. Subclavian artery stenosis status post intervention.   PROCEDURE: Right carotid endarterectomy with Dacron patch angioplasty.   SURGEON: Annice NeedyJason S. Mylie Mccurley, MD  ASSISTANT: Philomena CourseJason Kaylor, PA-C   ANESTHESIA: General.   ESTIMATED BLOOD LOSS: Approximately 100 mL.   INDICATIONS FOR THE PROCEDURE: This is a 69 year old white female who we saw a couple months ago for high-grade carotid artery stenosis. Preliminary to this she had cardiac evaluation and was found to have a high-grade subclavian artery stenosis proximal to her LIMA bypass graft and this was treated last month. She has recovered well from this and is now ready for her carotid endarterectomy for stroke risk reduction. Risks and benefits were discussed. Informed consent was obtained.   DESCRIPTION OF PROCEDURE: Patient was brought to the operative suite and after an adequate level of general endotracheal anesthesia was obtained she was placed in modified beach chair position. Her head was flexed and turned to the side and a roll was placed under her shoulders. The right neck was sterilely prepped and draped and a sterile surgical field was created. Collier Flowersoban was used for an occlusive seal. Incision was created along the anterior border of sternocleidomastoid. We dissected down through the platysma with electrocautery. The sternocleidomastoid was identified and retracted laterally. There was not a dominant facial vein but several vein branches that were ligated and divided between silk ties and this exposed the carotid bifurcation. Due to the size of her neck and the depth of the  artery this was somewhat deep and large. Weitlaner retractors were used to help facilitate our exposure. The common carotid artery was encircled with vessel loops. The external carotid artery, superior thyroid artery dissected out and encircled with vessel loops and the internal carotid artery well distal to the lesion was encircled with vessel loop. Patient was given 7000 units of intravenous heparin for systemic anticoagulation. This was allowed to circulate for over five minutes and control was pulled up on the vessel loops. An anterior wall arteriotomy was created with an 11 blade and extended with Potts scissors and a Pruitt-Inahara shunt was placed first in the internal carotid artery, flushed and de-aired then the common carotid artery, flushed and de-aired and then flow was restored. Approximately two minutes elapsed from clamp to shunt placement. An endarterectomy was performed in the usual fashion with a Runner, broadcasting/film/videoenfield elevator. The proximal endpoint was cut flush with tenotomy scissors. An eversion endarterectomy was performed on the external carotid artery and a nice tethered distal endpoint was created using removal of the plaque upon traction. The wound was copiously irrigated and all loose flecks were removed and a Dacron patch was brought onto the field. This was cut and beveled distally. A 6-0 Prolene suture was started at the distal endpoint and run one-half the length of the arteriotomy and the patch was then cut and beveled to an appropriate length proximally and a second 6-0 Prolene was started at the proximal endpoint. The medial suture line was run and tied to the superior suture line and then we ran the lateral proximal suture line one-quarter the length of the arteriotomy, removed the Pruitt-Inahara shunt, flushed the vessel from the internal,  external, and common carotid arteries and then irrigated it locally with heparin. The arteriotomy was completed flushing through the external carotid artery  several cardiac cycles prior to release of control of the internal carotid artery. Three 6-0 Prolene patch suture were used for hemostasis and hemostasis was complete. The wound was then copiously irrigated with antibiotic impregnated saline, Surgicel and Evicyl topical hemostatic agents were placed. The wound was then closed with three interrupted 3-0 Vicryl in the sternocleidomastoid space. The platysma was closed with a running 3-0 Vicryl and the skin was closed with 4-0 Monocryl. Sterile dressing was placed. The patient tolerated procedure well and was taken to the recovery room in stable condition.   ____________________________ Annice Needy, MD jsd:cms D: 06/18/2011 10:10:49 ET T: 06/18/2011 10:27:16 ET JOB#: [id number]  cc: Annice Needy, MD, <Dictator> Annice Needy MD ELECTRONICALLY SIGNED 07/16/2011 11:11

## 2014-12-07 DIAGNOSIS — Z79899 Other long term (current) drug therapy: Secondary | ICD-10-CM | POA: Diagnosis not present

## 2014-12-07 DIAGNOSIS — E780 Pure hypercholesterolemia: Secondary | ICD-10-CM | POA: Diagnosis not present

## 2014-12-19 DIAGNOSIS — I10 Essential (primary) hypertension: Secondary | ICD-10-CM | POA: Diagnosis not present

## 2014-12-19 DIAGNOSIS — F419 Anxiety disorder, unspecified: Secondary | ICD-10-CM | POA: Diagnosis not present

## 2014-12-19 DIAGNOSIS — E780 Pure hypercholesterolemia: Secondary | ICD-10-CM | POA: Diagnosis not present

## 2014-12-19 DIAGNOSIS — I2510 Atherosclerotic heart disease of native coronary artery without angina pectoris: Secondary | ICD-10-CM | POA: Diagnosis not present

## 2015-01-23 DIAGNOSIS — I10 Essential (primary) hypertension: Secondary | ICD-10-CM | POA: Diagnosis not present

## 2015-01-23 DIAGNOSIS — I6529 Occlusion and stenosis of unspecified carotid artery: Secondary | ICD-10-CM | POA: Diagnosis not present

## 2015-01-23 DIAGNOSIS — E785 Hyperlipidemia, unspecified: Secondary | ICD-10-CM | POA: Diagnosis not present

## 2015-01-23 DIAGNOSIS — I2510 Atherosclerotic heart disease of native coronary artery without angina pectoris: Secondary | ICD-10-CM | POA: Diagnosis not present

## 2015-01-23 DIAGNOSIS — I6523 Occlusion and stenosis of bilateral carotid arteries: Secondary | ICD-10-CM | POA: Diagnosis not present

## 2015-03-06 DIAGNOSIS — I25810 Atherosclerosis of coronary artery bypass graft(s) without angina pectoris: Secondary | ICD-10-CM | POA: Diagnosis not present

## 2015-03-06 DIAGNOSIS — I2510 Atherosclerotic heart disease of native coronary artery without angina pectoris: Secondary | ICD-10-CM | POA: Diagnosis not present

## 2015-03-06 DIAGNOSIS — I739 Peripheral vascular disease, unspecified: Secondary | ICD-10-CM | POA: Diagnosis not present

## 2015-03-06 DIAGNOSIS — I10 Essential (primary) hypertension: Secondary | ICD-10-CM | POA: Diagnosis not present

## 2015-03-06 DIAGNOSIS — M109 Gout, unspecified: Secondary | ICD-10-CM | POA: Diagnosis not present

## 2015-03-06 DIAGNOSIS — F411 Generalized anxiety disorder: Secondary | ICD-10-CM | POA: Diagnosis not present

## 2015-07-16 DIAGNOSIS — E7800 Pure hypercholesterolemia, unspecified: Secondary | ICD-10-CM | POA: Diagnosis not present

## 2015-07-16 DIAGNOSIS — Z79899 Other long term (current) drug therapy: Secondary | ICD-10-CM | POA: Diagnosis not present

## 2015-07-22 ENCOUNTER — Emergency Department: Payer: Commercial Managed Care - HMO

## 2015-07-22 ENCOUNTER — Encounter: Payer: Self-pay | Admitting: Emergency Medicine

## 2015-07-22 ENCOUNTER — Inpatient Hospital Stay
Admission: EM | Admit: 2015-07-22 | Discharge: 2015-07-25 | DRG: 694 | Disposition: A | Discharge status: Home / Self Care | Payer: Commercial Managed Care - HMO | Attending: Internal Medicine | Admitting: Internal Medicine

## 2015-07-22 DIAGNOSIS — Z87891 Personal history of nicotine dependence: Secondary | ICD-10-CM | POA: Diagnosis not present

## 2015-07-22 DIAGNOSIS — N132 Hydronephrosis with renal and ureteral calculous obstruction: Principal | ICD-10-CM | POA: Diagnosis present

## 2015-07-22 DIAGNOSIS — N12 Tubulo-interstitial nephritis, not specified as acute or chronic: Secondary | ICD-10-CM | POA: Diagnosis not present

## 2015-07-22 DIAGNOSIS — N201 Calculus of ureter: Secondary | ICD-10-CM | POA: Diagnosis not present

## 2015-07-22 DIAGNOSIS — Z79899 Other long term (current) drug therapy: Secondary | ICD-10-CM | POA: Diagnosis not present

## 2015-07-22 DIAGNOSIS — B9620 Unspecified Escherichia coli [E. coli] as the cause of diseases classified elsewhere: Secondary | ICD-10-CM | POA: Diagnosis present

## 2015-07-22 DIAGNOSIS — R109 Unspecified abdominal pain: Secondary | ICD-10-CM | POA: Insufficient documentation

## 2015-07-22 DIAGNOSIS — R1031 Right lower quadrant pain: Secondary | ICD-10-CM | POA: Diagnosis not present

## 2015-07-22 DIAGNOSIS — N179 Acute kidney failure, unspecified: Secondary | ICD-10-CM | POA: Diagnosis present

## 2015-07-22 DIAGNOSIS — N3001 Acute cystitis with hematuria: Secondary | ICD-10-CM | POA: Diagnosis not present

## 2015-07-22 DIAGNOSIS — K5900 Constipation, unspecified: Secondary | ICD-10-CM | POA: Diagnosis present

## 2015-07-22 DIAGNOSIS — Z7982 Long term (current) use of aspirin: Secondary | ICD-10-CM

## 2015-07-22 DIAGNOSIS — Z951 Presence of aortocoronary bypass graft: Secondary | ICD-10-CM

## 2015-07-22 DIAGNOSIS — N3090 Cystitis, unspecified without hematuria: Secondary | ICD-10-CM | POA: Diagnosis present

## 2015-07-22 DIAGNOSIS — I25810 Atherosclerosis of coronary artery bypass graft(s) without angina pectoris: Secondary | ICD-10-CM | POA: Diagnosis not present

## 2015-07-22 DIAGNOSIS — N200 Calculus of kidney: Secondary | ICD-10-CM | POA: Diagnosis not present

## 2015-07-22 DIAGNOSIS — I10 Essential (primary) hypertension: Secondary | ICD-10-CM | POA: Diagnosis present

## 2015-07-22 HISTORY — DX: Essential (primary) hypertension: I10

## 2015-07-22 LAB — URINALYSIS COMPLETE WITH MICROSCOPIC (ARMC ONLY)
BILIRUBIN URINE: NEGATIVE
Glucose, UA: NEGATIVE mg/dL
KETONES UR: NEGATIVE mg/dL
NITRITE: POSITIVE — AB
PH: 5 (ref 5.0–8.0)
PROTEIN: 30 mg/dL — AB
Specific Gravity, Urine: 1.019 (ref 1.005–1.030)
Trans Epithel, UA: 2

## 2015-07-22 LAB — COMPREHENSIVE METABOLIC PANEL
ALBUMIN: 4.1 g/dL (ref 3.5–5.0)
ALK PHOS: 163 U/L — AB (ref 38–126)
ALT: 19 U/L (ref 14–54)
ANION GAP: 11 (ref 5–15)
AST: 34 U/L (ref 15–41)
BUN: 15 mg/dL (ref 6–20)
CHLORIDE: 103 mmol/L (ref 101–111)
CO2: 25 mmol/L (ref 22–32)
CREATININE: 1.32 mg/dL — AB (ref 0.44–1.00)
Calcium: 9.3 mg/dL (ref 8.9–10.3)
GFR calc Af Amer: 46 mL/min — ABNORMAL LOW (ref >60)
GFR calc non Af Amer: 40 mL/min — ABNORMAL LOW (ref >60)
Glucose, Bld: 117 mg/dL — ABNORMAL HIGH (ref 65–99)
Potassium: 3.7 mmol/L (ref 3.5–5.1)
SODIUM: 139 mmol/L (ref 135–145)
Total Bilirubin: 0.6 mg/dL (ref 0.3–1.2)
Total Protein: 7.5 g/dL (ref 6.5–8.1)

## 2015-07-22 LAB — CBC
HCT: 41.5 % (ref 35.0–47.0)
Hemoglobin: 14.1 g/dL (ref 12.0–16.0)
MCH: 32 pg (ref 26.0–34.0)
MCHC: 34 g/dL (ref 32.0–36.0)
MCV: 94.4 fL (ref 80.0–100.0)
PLATELETS: 226 10*3/uL (ref 150–440)
RBC: 4.4 MIL/uL (ref 3.80–5.20)
RDW: 12.6 % (ref 11.5–14.5)
WBC: 3.5 10*3/uL — ABNORMAL LOW (ref 3.6–11.0)

## 2015-07-22 LAB — TROPONIN I: Troponin I: 0.03 ng/mL (ref <0.031)

## 2015-07-22 MED ORDER — SODIUM CHLORIDE 0.9 % IV BOLUS (SEPSIS)
1000.0000 mL | Freq: Once | INTRAVENOUS | Status: AC
  Administered 2015-07-22: 1000 mL via INTRAVENOUS

## 2015-07-22 MED ORDER — MORPHINE SULFATE (PF) 2 MG/ML IV SOLN: 1.0000 mg | INTRAVENOUS | Status: DC | PRN

## 2015-07-22 MED ORDER — ONDANSETRON 4 MG PO TBDP
4.0000 mg | ORAL_TABLET | Freq: Once | ORAL | Status: AC | PRN
  Administered 2015-07-22: 4 mg via ORAL

## 2015-07-22 MED ORDER — ONDANSETRON HCL 4 MG/2ML IJ SOLN: 4.0000 mg | Freq: Four times a day (QID) | INTRAMUSCULAR | Status: DC | PRN

## 2015-07-22 MED ORDER — METOPROLOL TARTRATE 25 MG PO TABS
25.0000 mg | ORAL_TABLET | Freq: Two times a day (BID) | ORAL | Status: DC
  Administered 2015-07-23 – 2015-07-25 (×6): 25 mg via ORAL
  Filled 2015-07-22 (×7): qty 1

## 2015-07-22 MED ORDER — TAMSULOSIN HCL 0.4 MG PO CAPS
0.4000 mg | ORAL_CAPSULE | Freq: Every day | ORAL | Status: DC
  Administered 2015-07-22 – 2015-07-25 (×3): 0.4 mg via ORAL
  Filled 2015-07-22 (×3): qty 1

## 2015-07-22 MED ORDER — SODIUM CHLORIDE 0.9 % IV SOLN
INTRAVENOUS | Status: DC
  Administered 2015-07-22 – 2015-07-23 (×2): via INTRAVENOUS

## 2015-07-22 MED ORDER — CEFTRIAXONE SODIUM 1 G IJ SOLR
1.0000 g | INTRAMUSCULAR | Status: AC
  Administered 2015-07-22: 1 g via INTRAVENOUS
  Filled 2015-07-22: qty 10

## 2015-07-22 MED ORDER — ACETAMINOPHEN 650 MG RE SUPP: 650.0000 mg | Freq: Four times a day (QID) | RECTAL | Status: DC | PRN

## 2015-07-22 MED ORDER — HYDROCODONE-ACETAMINOPHEN 5-325 MG PO TABS
1.0000 | ORAL_TABLET | ORAL | Status: DC | PRN
  Administered 2015-07-23 (×2): 1 via ORAL
  Filled 2015-07-22 (×2): qty 1

## 2015-07-22 MED ORDER — ACETAMINOPHEN 325 MG PO TABS
650.0000 mg | ORAL_TABLET | Freq: Four times a day (QID) | ORAL | Status: DC | PRN
  Administered 2015-07-22 – 2015-07-25 (×3): 650 mg via ORAL
  Filled 2015-07-22 (×3): qty 2

## 2015-07-22 MED ORDER — DEXTROSE 5 % IV SOLN
1.0000 g | INTRAVENOUS | Status: DC
  Administered 2015-07-23: 1 g via INTRAVENOUS
  Filled 2015-07-22: qty 10

## 2015-07-22 MED ORDER — ENOXAPARIN SODIUM 40 MG/0.4ML ~~LOC~~ SOLN
40.0000 mg | SUBCUTANEOUS | Status: DC
  Administered 2015-07-22 – 2015-07-24 (×3): 40 mg via SUBCUTANEOUS
  Filled 2015-07-22 (×3): qty 0.4

## 2015-07-22 MED ORDER — LISINOPRIL 20 MG PO TABS
20.0000 mg | ORAL_TABLET | Freq: Every day | ORAL | Status: DC
  Administered 2015-07-23 – 2015-07-25 (×3): 20 mg via ORAL
  Filled 2015-07-22 (×4): qty 1

## 2015-07-22 MED ORDER — ATORVASTATIN CALCIUM 20 MG PO TABS
40.0000 mg | ORAL_TABLET | Freq: Every day | ORAL | Status: DC
  Administered 2015-07-22 – 2015-07-24 (×3): 40 mg via ORAL
  Filled 2015-07-22 (×3): qty 2

## 2015-07-22 MED ORDER — ONDANSETRON 4 MG PO TBDP
ORAL_TABLET | ORAL | Status: AC
  Filled 2015-07-22: qty 1

## 2015-07-22 MED ORDER — SENNOSIDES-DOCUSATE SODIUM 8.6-50 MG PO TABS: 1.0000 | ORAL_TABLET | Freq: Every evening | ORAL | Status: DC | PRN

## 2015-07-22 MED ORDER — KETOROLAC TROMETHAMINE 30 MG/ML IJ SOLN
15.0000 mg | Freq: Once | INTRAMUSCULAR | Status: AC
  Administered 2015-07-22: 15 mg via INTRAVENOUS
  Filled 2015-07-22: qty 1

## 2015-07-22 MED ORDER — ONDANSETRON HCL 4 MG PO TABS: 4.0000 mg | ORAL_TABLET | Freq: Four times a day (QID) | ORAL | Status: DC | PRN

## 2015-07-22 MED ORDER — CLORAZEPATE DIPOTASSIUM 7.5 MG PO TABS
3.7500 mg | ORAL_TABLET | ORAL | Status: DC
  Administered 2015-07-23 – 2015-07-25 (×2): 3.75 mg via ORAL
  Filled 2015-07-22 (×3): qty 1

## 2015-07-22 MED ORDER — ACETAMINOPHEN 500 MG PO TABS
1000.0000 mg | ORAL_TABLET | Freq: Once | ORAL | Status: AC
  Administered 2015-07-22: 1000 mg via ORAL
  Filled 2015-07-22: qty 2

## 2015-07-22 NOTE — ED Notes (Signed)
Pt states has right mid back pain and vomiting since 1600 yesterday. Pt denies known fever, diarrhea. Skin pale.

## 2015-07-22 NOTE — Progress Notes (Signed)
ANTIBIOTIC CONSULT NOTE - INITIAL  Pharmacy Consult for Ceftriaxone  Indication: UTI  Allergies  Allergen Reactions  . Amoxicillin Other (See Comments)    Other Reaction: pt not sure if really allergic    Patient Measurements: Height:  (167.6 cm) Weight: 225 lb (102.059 kg) IBW/kg (Calculated) : 59.3 Adjusted Body Weight:   Vital Signs: Temp: 98.3 F (36.8 C) (02/19 0347) Temp Source: Oral (02/19 0347) BP: 104/60 mmHg (02/19 0730) Pulse Rate: 82 (02/19 0730) Intake/Output from previous day:   Intake/Output from this shift:    Labs:  Recent Labs  07/22/15 0352  WBC 3.5*  HGB 14.1  PLT 226  CREATININE 1.32*   Estimated Creatinine Clearance: 47.1 mL/min (by C-G formula based on Cr of 1.32). No results for input(s): VANCOTROUGH, VANCOPEAK, VANCORANDOM, GENTTROUGH, GENTPEAK, GENTRANDOM, TOBRATROUGH, TOBRAPEAK, TOBRARND, AMIKACINPEAK, AMIKACINTROU, AMIKACIN in the last 72 hours.   Microbiology: No results found for this or any previous visit (from the past 720 hour(s)).  Medical History: Past Medical History  Diagnosis Date  . Hypertension     Medications:   (Not in a hospital admission) Assessment: CrCl = 47.1 ml/min   Goal of Therapy:  resolution of infection   Plan:  Expected duration 7 days with resolution of temperature and/or normalization of WBC   Ceftriaxone 1 gm IV X 1 given on 2/19. Ceftriaxone 1 gm IV Q24H ordered to start 2/20 @ 10:00.   Dajana Gehrig D 07/22/2015,10:56 AM

## 2015-07-22 NOTE — H&P (Signed)
[facility] -  at [facility]   PATIENT NAME: Molly Esparza    MR#:  [id number]  DATE OF BIRTH:  1945/08/22  DATE OF ADMISSION:  07/22/2015  PRIMARY CARE PHYSICIAN: BABAOFF, Lavada Mesi, MD   REQUESTING/REFERRING PHYSICIAN: Dr Huel Cote  CHIEF COMPLAINT:   back pain  HISTORY OF PRESENT ILLNESS:  Molly Esparza  is a 70 y.o. female with a known history of HTN who presents with back pain and nausea. Over the past day patient has had right-sided back pain.  She had CT scan which shows 4 mm stone at the right ureteropelvic junction with moderate right hydronephrosis. Patient reports she has doubled up on her calcium supplementation recently. She drinks over a liter of fluids daily.  Marland Kitchen PAST MEDICAL HISTORY:   Past Medical History  Diagnosis Date  . Hypertension     PAST SURGICAL HISTORY:   Past Surgical History  Procedure Laterality Date  . Coronary artery bypass graft      SOCIAL HISTORY:   Social History  Substance Use Topics  . Smoking status: Former Games developer  . Smokeless tobacco: Never Used  . Alcohol Use: No    FAMILY HISTORY:  No family history on file.  DRUG ALLERGIES:   Allergies  Allergen Reactions  . Amoxicillin Other (See Comments)    Other Reaction: pt not sure if really allergic     REVIEW OF SYSTEMS:  CONSTITUTIONAL: No fever, fatigue or weakness.  EYES: No blurred or double vision.  EARS, NOSE, AND THROAT: No tinnitus or ear pain.  RESPIRATORY: No cough, shortness of breath, wheezing or hemoptysis.  CARDIOVASCULAR: No chest pain, orthopnea, edema.  GASTROINTESTINAL: ++ nausea,NO vomiting, diarrhea or abdominal pain.  GENITOURINARY: No dysuria, hematuria.  ENDOCRINE: No polyuria, nocturia,  HEMATOLOGY: No anemia, easy bruising or bleeding SKIN: No rash or lesion. MUSCULOSKELETAL: No joint pain or arthritis.  Right back pain NEUROLOGIC: No tingling, numbness, weakness.  PSYCHIATRY: No anxiety or depression.   MEDICATIONS  AT HOME:   Prior to Admission medications   Medication Sig Start Date End Date Taking? Authorizing Provider  aspirin EC 81 MG tablet Take 81 mg by mouth every morning.   Yes Historical Provider, MD  atorvastatin (LIPITOR) 40 MG tablet Take 1 tablet by mouth every morning.  07/03/15  Yes Historical Provider, MD  Calcium-Magnesium-Vitamin D (CALCIUM 500 PO) Take 500 mg by mouth 2 (two) times daily.   Yes Historical Provider, MD  clorazepate (TRANXENE) 3.75 MG tablet Take 3.75 mg by mouth every morning.   Yes Historical Provider, MD  furosemide (LASIX) 20 MG tablet Take 1 tablet by mouth daily. 07/03/15  Yes Historical Provider, MD  lisinopril (PRINIVIL,ZESTRIL) 20 MG tablet Take 1 tablet by mouth daily. 07/03/15  Yes Historical Provider, MD  metoprolol tartrate (LOPRESSOR) 25 MG tablet Take 25 mg by mouth 2 (two) times daily.   Yes Historical Provider, MD  Multiple Vitamin (MULTIVITAMIN) tablet Take 1 tablet by mouth daily.   Yes Historical Provider, MD      VITAL SIGNS:  Blood pressure 104/60, pulse 82, temperature 98.3 F (36.8 C), temperature source Oral, resp. rate 21, height 5\' 6"  (1.676 m), weight 102.059 kg (225 lb), SpO2 97 %.  PHYSICAL EXAMINATION:  GENERAL:  70 y.o.-year-old patient lying in the bed with no acute distress.  EYES: Pupils equal, round, reactive to light and accommodation. No scleral icterus. Extraocular muscles intact.  HEENT: Head atraumatic, normocephalic. Oropharynx and nasopharynx clear.  NECK:  Supple, no jugular venous  distention. No thyroid enlargement, no tenderness.  LUNGS: Normal breath sounds bilaterally, no wheezing, rales,rhonchi or crepitation. No use of accessory muscles of respiration.  CARDIOVASCULAR: S1, S2 normal. No murmurs, rubs, or gallops.  ABDOMEN: Soft, nontender, nondistended. Bowel sounds present. No organomegaly or mass.  Right CVAT EXTREMITIES: No pedal edema, cyanosis, or clubbing.  NEUROLOGIC: Cranial nerves II through XII are grossly  intact. No focal deficits. PSYCHIATRIC: The patient is alert and oriented x 3.  SKIN: No obvious rash, lesion, or ulcer.   LABORATORY PANEL:   CBC  Recent Labs Lab 07/22/15 0352  WBC 3.5*  HGB 14.1  HCT 41.5  PLT 226   ------------------------------------------------------------------------------------------------------------------  Chemistries   Recent Labs Lab 07/22/15 0352  NA 139  K 3.7  CL 103  CO2 25  GLUCOSE 117*  BUN 15  CREATININE 1.32*  CALCIUM 9.3  AST 34  ALT 19  ALKPHOS 163*  BILITOT 0.6   ------------------------------------------------------------------------------------------------------------------  Cardiac Enzymes  Recent Labs Lab 07/22/15 0352  TROPONINI 0.03   ------------------------------------------------------------------------------------------------------------------  RADIOLOGY:  Ct Renal Stone Study  07/22/2015  CLINICAL DATA:  Pt states right sided pain started yesterday. Pt states the pain today has increased. Pt denies hx of surgery to abd/pelvis. EXAM: CT ABDOMEN AND PELVIS WITHOUT CONTRAST TECHNIQUE: Multidetector CT imaging of the abdomen and pelvis was performed following the standard protocol without IV contrast. COMPARISON:  None. FINDINGS: Lung bases: Mild left basilar atelectasis. Otherwise clear. Heart normal size. Status post median sternotomy. Liver:  Heterogeneous fatty infiltration.  No mass or focal lesion. Spleen, gallbladder, pancreas, adrenal glands: Normal. Kidneys, ureters, bladder: 4 mm stone at the right ureteropelvic junction causes moderate right hydronephrosis and significant right perinephric stranding and right renal edema. No right renal mass or intrarenal stones. There is left renal cortical thinning with no masses or stones. Normal left renal collecting system ureter. Bladder is unremarkable. Uterus and adnexa:  Normal. Lymph nodes:  No adenopathy. Ascites:  None. Vascular: Dense atherosclerotic calcifications  noted along the abdominal aorta. No aneurysm. Gastrointestinal: Colonic diverticula. No diverticulitis. Colon otherwise unremarkable. Normal stomach and small bowel. Normal appendix. Musculoskeletal: Degenerative changes throughout the visualized spine. Degenerative anterolisthesis of L4 on L5. No osteoblastic or osteolytic lesions. IMPRESSION: 1. 4 mm stone at the right ureteropelvic junction causes moderate right hydronephrosis and significant right perinephric stranding. 2. No other acute findings.  No intrarenal stones. 3. Hepatic steatosis. Electronically Signed   By: Amie Portland M.D.   On: 07/22/2015 08:30    EKG:     IMPRESSION AND PLAN:   71 y/o female with HTN who has  4 mm stone at the right ureteropelvic junction causes moderate right hydronephrosis and significant right perinephric stranding on CT scan.  1.  4 mm stone at the right ureteropelvic junction causes moderate right hydronephrosis and significant right perinephric stranding: Continue IV fluids, pain medications and antiemetics. Urology consultation for further evaluation.  Strain urine to collect kidney stone.   2. Urinary tract infection with hematuria: Hematuria secondary to kidney stone. Continue Rocephin and follow up on urine culture.   3. Essential hypertension: Continue metoprolol and lisinopril.    4. Acute kidney injury: Due to problem #1. Continue IV fluids and repeat BMP in a.m.  All the records are reviewed and case discussed with ED provider. Management plans discussed with the patient and she is in agreement.  CODE STATUS: FULL  TOTAL TIME TAKING CARE OF THIS PATIENT: 35 minutes.    Rosha Cocker M.D on  07/22/2015 at 10:39 AM  Between 7am to 6pm - Pager - [phone] After 6pm go to [email or web address] - password [email or web address] [facility]  [facility]  Office  [phone]  CC: Primary care physician; BABAOFF, Lavada Mesi, MD

## 2015-07-22 NOTE — ED Provider Notes (Signed)
Time Seen: Approximately *0705  I have reviewed the triage notes  Chief Complaint: Emesis   History of Present Illness: Molly Esparza is a 70 y.o. female who presents with some onset of right-sided mid back pain along with some right anterior abdominal pain that started around 3 PM yesterday with some progression. She denies any exacerbating or relieving factors. She states that she's noticed some generalized weakness and states overall that she feels "" shaky "". She denies any persistent nausea vomiting, loose stool or diarrhea. She denies any dysuria, hematuria or urinary frequency she states that she did have yesterday when her pain started some nausea and vomiting at that time but none current. She denies any left-sided symptoms. She denies any chest pain or shortness of breath.   Past Medical History  Diagnosis Date  . Hypertension     Patient Active Problem List   Diagnosis Date Noted  . UNSPECIFIED ESSENTIAL HYPERTENSION 06/17/2010  . CORONARY ATHEROSCLEROSIS NATIVE CORONARY ARTERY 06/17/2010    Past Surgical History  Procedure Laterality Date  . Coronary artery bypass graft      Past Surgical History  Procedure Laterality Date  . Coronary artery bypass graft      Current Outpatient Rx  Name  Route  Sig  Dispense  Refill  . atorvastatin (LIPITOR) 40 MG tablet   Oral   Take 1 tablet by mouth daily.         . furosemide (LASIX) 20 MG tablet   Oral   Take 1 tablet by mouth daily.         Marland Kitchen lisinopril (PRINIVIL,ZESTRIL) 20 MG tablet   Oral   Take 1 tablet by mouth daily.           Allergies:  Review of patient's allergies indicates no known allergies.  Family History: No family history on file.  Social History: Social History  Substance Use Topics  . Smoking status: Former Games developer  . Smokeless tobacco: Never Used  . Alcohol Use: No     Review of Systems:   10 point review of systems was performed and was otherwise  negative:  Constitutional: No fever Eyes: No visual disturbances ENT: No sore throat, ear pain Cardiac: No chest pain Respiratory: No shortness of breath, wheezing, or stridor Abdomen: No abdominal pain, no vomiting, No diarrhea Endocrine: No weight loss, No night sweats Extremities: No peripheral edema, cyanosis Skin: No rashes, easy bruising Neurologic: No focal weakness, trouble with speech or swollowing Urologic: No dysuria, Hematuria, or urinary frequency   Physical Exam:  ED Triage Vitals  Enc Vitals Group     BP 07/22/15 0347 133/113 mmHg     Pulse Rate 07/22/15 0347 84     Resp 07/22/15 0347 18     Temp 07/22/15 0347 98.3 F (36.8 C)     Temp Source 07/22/15 0347 Oral     SpO2 07/22/15 0347 96 %     Weight 07/22/15 0347 225 lb (102.059 kg)     Height 07/22/15 0347  (1.676 m)     Head Cir --      Peak Flow --      Pain Score 07/22/15 0348 6     Pain Loc --      Pain Edu? --      Excl. in GC? --     General: Awake , Alert , and Oriented times 3; GCS 15 Head: Normal cephalic , atraumatic Eyes: Pupils equal , round, reactive to light Nose/Throat:  No nasal drainage, patent upper airway without erythema or exudate.  Neck: Supple, Full range of motion, No anterior adenopathy or palpable thyroid masses Lungs: Clear to ascultation without wheezes , rhonchi, or rales Heart: Regular rate, regular rhythm without murmurs , gallops , or rubs Abdomen: Soft, non tender without rebound, guarding , or rigidity; bowel sounds positive and symmetric in all 4 quadrants. No organomegaly . No obvious reproducible pain over McBurney's point, negative Murphy's sign. No reproducible right flank pain       Extremities: 2 plus symmetric pulses. No edema, clubbing or cyanosis Neurologic: normal ambulation, Motor symmetric without deficits, sensory intact Skin: warm, dry, no rashes   Labs:   All laboratory work was reviewed including any pertinent negatives or positives listed below:   Labs Reviewed  COMPREHENSIVE METABOLIC PANEL - Abnormal; Notable for the following:    Glucose, Bld 117 (*)    Creatinine, Ser 1.32 (*)    Alkaline Phosphatase 163 (*)    GFR calc non Af Amer 40 (*)    GFR calc Af Amer 46 (*)    All other components within normal limits  CBC - Abnormal; Notable for the following:    WBC 3.5 (*)    All other components within normal limits  URINALYSIS COMPLETEWITH MICROSCOPIC ([facility] ONLY) - Abnormal; Notable for the following:    Color, Urine YELLOW (*)    APPearance CLOUDY (*)    Hgb urine dipstick 1+ (*)    Protein, ur 30 (*)    Nitrite POSITIVE (*)    Leukocytes, UA 3+ (*)    Bacteria, UA MANY (*)    Squamous Epithelial / LPF 0-5 (*)    All other components within normal limits  URINE CULTURE  TROPONIN I   review of the laboratory work is significant for urinary tract infection and urine cultures pending  EKG:  ED ECG REPORT I, Jennye Moccasin, the attending physician, personally viewed and interpreted this ECG.  Date: 07/22/2015 EKG Time: 0348 Rate: 87 Rhythm: normal sinus rhythm with premature atrial, complexes. QRS Axis: normal Intervals: normal ST/T Wave abnormalities: normal Conduction Disturbances: none Narrative Interpretation: unremarkable No acute ischemic changes noted Left ventricular hypertrophy   Radiology:     EXAM: CT ABDOMEN AND PELVIS WITHOUT CONTRAST  TECHNIQUE: Multidetector CT imaging of the abdomen and pelvis was performed following the standard protocol without IV contrast.  COMPARISON: None.  FINDINGS: Lung bases: Mild left basilar atelectasis. Otherwise clear. Heart normal size. Status post median sternotomy.  Liver: Heterogeneous fatty infiltration. No mass or focal lesion.  Spleen, gallbladder, pancreas, adrenal glands: Normal.  Kidneys, ureters, bladder: 4 mm stone at the right ureteropelvic junction causes moderate right hydronephrosis and significant right perinephric stranding and  right renal edema. No right renal mass or intrarenal stones. There is left renal cortical thinning with no masses or stones. Normal left renal collecting system ureter. Bladder is unremarkable.  Uterus and adnexa: Normal.  Lymph nodes: No adenopathy.  Ascites: None.  Vascular: Dense atherosclerotic calcifications noted along the abdominal aorta. No aneurysm.  Gastrointestinal: Colonic diverticula. No diverticulitis. Colon otherwise unremarkable. Normal stomach and small bowel. Normal appendix.  Musculoskeletal: Degenerative changes throughout the visualized spine. Degenerative anterolisthesis of L4 on L5. No osteoblastic or osteolytic lesions.  IMPRESSION: 1. 4 mm stone at the right ureteropelvic junction causes moderate right hydronephrosis and significant right perinephric stranding. 2. No other acute findings. No intrarenal stones. 3. Hepatic steatosis.   I personally reviewed the radiologic studies  ED Course:  Patient's stay here was uneventful and she did show some improvement with Toradol, Zofran, etc. Patient was noted to have a significant urinary tract infection was started on IV Rocephin here in emergency department. The CAT scan shows what appears to be a proximal obstructing stone which is of concern for development of significant pyelonephritis. The patient's otherwise hemodynamically stable and I felt was not septic at this time though I felt she needed inpatient observation to make sure she continues to progress. Patient's case was reviewed with the hospitalist team, further disposition and management depends upon their evaluation    Assessment: Acute renal colic Pyelonephritis   Final Clinical Impression: *  Final diagnoses:  Right flank pain     Plan: * Internal medicine evaluation           Jennye Moccasin, MD 07/22/15 [phone]

## 2015-07-22 NOTE — ED Notes (Signed)
Patient with complaint of right lower back pain radiating to right lower abd and nausea and vomiting that started yesterday at 16:00

## 2015-07-22 NOTE — ED Notes (Signed)
Patient transported to CT 

## 2015-07-22 NOTE — Consult Note (Addendum)
 @ 2:05 PM   Molly Esparza 24-Jan-1946 [id number]  Referring provider: Dr. Camillia Herter  Chief Complaint  Patient presents with  . Emesis    HPI: The patient is a 70 year old female with no past GU history presented to the emergency room with chest pain and right flank pain. Workup revealed a proximal stone approximately 4 mm in size on the right side with mild hydronephrosis. The patient did have a urinalysis concerning for infection however squamous cells were present. She has no signs of sepsis at this time with a normal white count and stable vital signs. She has never had a kidney stone before. She has had urinary tract infections many years ago and she does not have symptoms now that she experienced during the time with her urinary tract infections. Her main complaint was her right flank pain which is now resolved. She has had no fevers, chills, nausea, or vomiting. Urology was consulted for further recommendations regarding her care.  Of note, the patient is currently eating.   PMH: Past Medical History  Diagnosis Date  . Hypertension     Surgical History: Past Surgical History  Procedure Laterality Date  . Coronary artery bypass graft      Home Medications:    Medication List    ASK your doctor about these medications        aspirin EC 81 MG tablet  Take 81 mg by mouth every morning.     atorvastatin 40 MG tablet  Commonly known as:  LIPITOR  Take 1 tablet by mouth every morning.     CALCIUM 500 PO  Take 500 mg by mouth 2 (two) times daily.     clorazepate 3.75 MG tablet  Commonly known as:  TRANXENE  Take 3.75 mg by mouth every morning.     furosemide 20 MG tablet  Commonly known as:  LASIX  Take 1 tablet by mouth daily.     lisinopril 20 MG tablet  Commonly known as:  PRINIVIL,ZESTRIL  Take 1 tablet by mouth daily.     metoprolol tartrate 25 MG tablet  Commonly known as:  LOPRESSOR  Take 25 mg by mouth 2 (two) times daily.     multivitamin  tablet  Take 1 tablet by mouth daily.        Allergies:  Allergies  Allergen Reactions  . Amoxicillin Other (See Comments)    Other Reaction: pt not sure if really allergic    Family History: No family history on file.  Social History:  reports that she has quit smoking. She has never used smokeless tobacco. She reports that she does not drink alcohol or use illicit drugs.  ROS: 12 point ROS negative except per HPI                                        Physical Exam: BP 122/42 mmHg  Pulse 72  Temp(Src) 98.5 F (36.9 C) (Oral)  Resp 25  Ht  (1.676 m)  Wt 225 lb (102.059 kg)  BMI 36.33 kg/m2  SpO2 95%  Constitutional:  Alert and oriented, No acute distress. HEENT: [place] AT, moist mucus membranes.  Trachea midline, no masses. Cardiovascular: No clubbing, cyanosis, or edema. Respiratory: Normal respiratory effort, no increased work of breathing. GI: Abdomen is soft, nontender, nondistended, no abdominal masses GU: No CVA tenderness.  Skin: No rashes, bruises or suspicious lesions. Lymph:  No cervical or inguinal adenopathy. Neurologic: Grossly intact, no focal deficits, moving all 4 extremities. Psychiatric: Normal mood and affect.  Laboratory Data: Lab Results  Component Value Date   WBC 3.5* 07/22/2015   HGB 14.1 07/22/2015   HCT 41.5 07/22/2015   MCV 94.4 07/22/2015   PLT 226 07/22/2015    Lab Results  Component Value Date   CREATININE 1.32* 07/22/2015    No results found for: PSA  No results found for: TESTOSTERONE  No results found for: HGBA1C  Urinalysis    Component Value Date/Time   COLORURINE YELLOW* 07/22/2015 0351   COLORURINE Amber 02/17/2013 1524   APPEARANCEUR CLOUDY* 07/22/2015 0351   APPEARANCEUR Hazy 02/17/2013 1524   LABSPEC 1.019 07/22/2015 0351   LABSPEC 1.017 02/17/2013 1524   PHURINE 5.0 07/22/2015 0351   PHURINE 5.0 02/17/2013 1524   GLUCOSEU NEGATIVE 07/22/2015 0351   GLUCOSEU Negative 02/17/2013  1524   HGBUR 1+* 07/22/2015 0351   HGBUR Negative 02/17/2013 1524   HGBUR Neg 06/17/2010 1557   BILIRUBINUR NEGATIVE 07/22/2015 0351   BILIRUBINUR Negative 02/17/2013 1524   KETONESUR NEGATIVE 07/22/2015 0351   KETONESUR Negative 02/17/2013 1524   PROTEINUR 30* 07/22/2015 0351   PROTEINUR Negative 02/17/2013 1524   UROBILINOGEN 0.2 06/17/2010 1557   NITRITE POSITIVE* 07/22/2015 0351   NITRITE Negative 02/17/2013 1524   LEUKOCYTESUR 3+* 07/22/2015 0351   LEUKOCYTESUR Negative 02/17/2013 1524    Pertinent Imaging: I personally reviewed the CT images which shows an approximately 4 mm proximal right ureteral stone with mild hydronephrosis.  Assessment & Plan:    The patient is currently asymptomatic from her 4 mm proximal right ureteral stone. Her pain is well-controlled. Her vitals are stable. She is afebrile without leukocytosis. She does have a urinalysis concerning for infection but she has no symptoms of urinary tract infection at this time and had squamous cells on the urinalysis. As the patient has minimal symptoms at this time, I think she can continue medical expulsive therapy. As long as she does not develop signs of sepsis or intolerable pain overnight which I don't expect, she can be discharged home in the morning from a urological perspective on medical expulsive therapy. I would continue antibiotics pending culture and sensitivity results.  1. 4 mm proximal right ureteral stone -Continue straining urine and IV fluids -Flomax 0.4 mg daily to assist in stone passage -As long as the patient does not develop intolerable pain or signs of sepsis, the patient can be discharged home Monday morning on a medical expulsive therapy and follow up with urology in 1-2 weeks.  2. Possible urinary tract infection -Continue antibiotics pending culture and sensitivity results  Hildred Laser, MD  [facility] [address] [place], [place]  [address] [phone]

## 2015-07-23 LAB — CBC
HEMATOCRIT: 34.3 % — AB (ref 35.0–47.0)
Hemoglobin: 11.4 g/dL — ABNORMAL LOW (ref 12.0–16.0)
MCH: 31.6 pg (ref 26.0–34.0)
MCHC: 33.3 g/dL (ref 32.0–36.0)
MCV: 94.9 fL (ref 80.0–100.0)
PLATELETS: 146 10*3/uL — AB (ref 150–440)
RBC: 3.61 MIL/uL — ABNORMAL LOW (ref 3.80–5.20)
RDW: 12.9 % (ref 11.5–14.5)
WBC: 11.4 10*3/uL — ABNORMAL HIGH (ref 3.6–11.0)

## 2015-07-23 LAB — BASIC METABOLIC PANEL
Anion gap: 6 (ref 5–15)
BUN: 19 mg/dL (ref 6–20)
CALCIUM: 8.2 mg/dL — AB (ref 8.9–10.3)
CO2: 25 mmol/L (ref 22–32)
Chloride: 106 mmol/L (ref 101–111)
Creatinine, Ser: 1.25 mg/dL — ABNORMAL HIGH (ref 0.44–1.00)
GFR calc Af Amer: 49 mL/min — ABNORMAL LOW (ref >60)
GFR calc non Af Amer: 42 mL/min — ABNORMAL LOW (ref >60)
GLUCOSE: 108 mg/dL — AB (ref 65–99)
POTASSIUM: 3.8 mmol/L (ref 3.5–5.1)
SODIUM: 137 mmol/L (ref 135–145)

## 2015-07-23 MED ORDER — DOCUSATE SODIUM 100 MG PO CAPS
100.0000 mg | ORAL_CAPSULE | Freq: Two times a day (BID) | ORAL | Status: DC
  Administered 2015-07-23 – 2015-07-24 (×2): 100 mg via ORAL
  Filled 2015-07-23 (×3): qty 1

## 2015-07-23 MED ORDER — CEPHALEXIN 250 MG PO CAPS
250.0000 mg | ORAL_CAPSULE | Freq: Two times a day (BID) | ORAL | Status: DC
  Administered 2015-07-23 – 2015-07-25 (×4): 250 mg via ORAL
  Filled 2015-07-23 (×6): qty 1

## 2015-07-23 NOTE — Care Management (Signed)
Patient admitted from home with kidney stone.  Patient lives at home alone.  States that her niece and cousin are local for support.  Patient obtains her medications from [facility] and states that she does not have any issues obtaining them.  Patient has a cane at home, but does not use it for ambulation.  Patient is independent of all ADL's and still drives.  No RNCM needs indicated.

## 2015-07-23 NOTE — Progress Notes (Signed)
[facility] - [place] at [facility]   PATIENT NAME: Molly Esparza    MR#:  [id number]  DATE OF BIRTH:  30-Dec-1945  SUBJECTIVE:  CHIEF COMPLAINT:   Chief Complaint  Patient presents with  . Emesis   feeling much better.  REVIEW OF SYSTEMS:  Review of Systems  Constitutional: Negative for fever, weight loss, malaise/fatigue and diaphoresis.  HENT: Negative for ear discharge, ear pain, hearing loss, nosebleeds, sore throat and tinnitus.   Eyes: Negative for blurred vision and pain.  Respiratory: Negative for cough, hemoptysis, shortness of breath and wheezing.   Cardiovascular: Negative for chest pain, palpitations, orthopnea and leg swelling.  Gastrointestinal: Negative for heartburn, nausea, vomiting, abdominal pain, diarrhea, constipation and blood in stool.  Genitourinary: Negative for dysuria, urgency and frequency.  Musculoskeletal: Negative for myalgias and back pain.  Skin: Negative for itching and rash.  Neurological: Negative for dizziness, tingling, tremors, focal weakness, seizures, weakness and headaches.  Psychiatric/Behavioral: Negative for depression. The patient is not nervous/anxious.     DRUG ALLERGIES:   Allergies  Allergen Reactions  . Amoxicillin Other (See Comments)    Other Reaction: pt not sure if really allergic   VITALS:  Blood pressure 121/47, pulse 64, temperature 99.3 F (37.4 C), temperature source Oral, resp. rate 16, height  (1.676 m), weight 102.059 kg (225 lb), SpO2 98 %. PHYSICAL EXAMINATION:  Physical Exam  Constitutional: She is oriented to person, place, and time and well-developed, well-nourished, and in no distress.  HENT:  Head: Normocephalic and atraumatic.  Eyes: Conjunctivae and EOM are normal. Pupils are equal, round, and reactive to light.  Neck: Normal range of motion. Neck supple. No tracheal deviation present. No thyromegaly present.  Cardiovascular: Normal rate, regular rhythm and normal heart  sounds.   Pulmonary/Chest: Effort normal and breath sounds normal. No respiratory distress. She has no wheezes. She exhibits no tenderness.  Abdominal: Soft. Bowel sounds are normal. She exhibits no distension. There is no tenderness.  Musculoskeletal: Normal range of motion.  Neurological: She is alert and oriented to person, place, and time. No cranial nerve deficit.  Skin: Skin is warm and dry. No rash noted.  Psychiatric: Mood and affect normal.   LABORATORY PANEL:   CBC  Recent Labs Lab 07/23/15 0530  WBC 11.4*  HGB 11.4*  HCT 34.3*  PLT 146*   ------------------------------------------------------------------------------------------------------------------ Chemistries   Recent Labs Lab 07/22/15 0352 07/23/15 0530  NA 139 137  K 3.7 3.8  CL 103 106  CO2 25 25  GLUCOSE 117* 108*  BUN 15 19  CREATININE 1.32* 1.25*  CALCIUM 9.3 8.2*  AST 34  --   ALT 19  --   ALKPHOS 163*  --   BILITOT 0.6  --    RADIOLOGY:  No results found. ASSESSMENT AND PLAN:   70 y/o female with HTN who has 4 mm stone at the right ureteropelvic junction causes moderate right hydronephrosis and significant right perinephric stranding on CT scan.  1. 4 mm stone at the right ureteropelvic junction causes moderate right hydronephrosis and significant right perinephric stranding: Can stop IV fluids, continue pain medications and antiemetics as need Appreciate Urology input - Recommended close monitoring and outpatient follow-up in 1-2 weeks  Strain urine to collect kidney stone.  2. Urinary tract infection with hematuria: Hematuria secondary to kidney stone.  Change  Her Rocephin to oral Keflex urine culture growing gram-negative rods, likely Escherichia coli Leukocytosis worsened.  WBC up from 3.5-11.4  3. Essential  hypertension: Continue metoprolol and lisinopril.   4. Acute kidney injury: Improved with IV hydration  All the records are reviewed and case discussed with  nursing.     All the records are reviewed and case discussed with Care Management/Social Worker. Management plans discussed with the patient, family and they are in agreement.  CODE STATUS: Full Code  TOTAL TIME TAKING CARE OF THIS PATIENT: 35 minutes.   More than 50% of the time was spent in counseling/coordination of care: YES  POSSIBLE D/C IN a.m., DEPENDING ON CLINICAL CONDITION.   [facility], Novis League M.D on 07/23/2015 at 5:36 PM  Between 7am to 6pm - Pager - [phone]  After 6pm go to [email or web address] - password [email or web address] [facility]  [place] [facility] [place] [facility]  Office  [phone]  CC: Primary care physician; BABAOFF, Lavada Mesi, MD  Note: This dictation was prepared with Dragon dictation along with smaller phrase technology. Any transcriptional errors that result from this process are unintentional.

## 2015-07-23 NOTE — Care Management Important Message (Signed)
Important Message  Patient Details  Name: Molly Esparza MRN: [id number] Date of Birth: 16-Nov-1945   Medicare Important Message Given:  Yes    Molly Esparza A Molly Esparza 07/23/2015, 10:23 AM

## 2015-07-24 ENCOUNTER — Inpatient Hospital Stay: Payer: Commercial Managed Care - HMO | Admitting: Anesthesiology

## 2015-07-24 ENCOUNTER — Encounter
Admission: EM | Disposition: A | Discharge status: Home / Self Care | Payer: Self-pay | Source: Home / Self Care | Attending: Internal Medicine

## 2015-07-24 ENCOUNTER — Encounter: Payer: Self-pay | Admitting: *Deleted

## 2015-07-24 DIAGNOSIS — N132 Hydronephrosis with renal and ureteral calculous obstruction: Secondary | ICD-10-CM | POA: Diagnosis not present

## 2015-07-24 DIAGNOSIS — N12 Tubulo-interstitial nephritis, not specified as acute or chronic: Secondary | ICD-10-CM | POA: Insufficient documentation

## 2015-07-24 DIAGNOSIS — R10A1 Flank pain, right side: Secondary | ICD-10-CM | POA: Insufficient documentation

## 2015-07-24 DIAGNOSIS — R109 Unspecified abdominal pain: Secondary | ICD-10-CM | POA: Insufficient documentation

## 2015-07-24 HISTORY — PX: URETEROSCOPY: SHX842

## 2015-07-24 HISTORY — PX: CYSTOSCOPY WITH STENT PLACEMENT: SHX5790

## 2015-07-24 LAB — BASIC METABOLIC PANEL
ANION GAP: 6 (ref 5–15)
BUN: 14 mg/dL (ref 6–20)
CALCIUM: 8.5 mg/dL — AB (ref 8.9–10.3)
CO2: 25 mmol/L (ref 22–32)
CREATININE: 1.15 mg/dL — AB (ref 0.44–1.00)
Chloride: 108 mmol/L (ref 101–111)
GFR calc Af Amer: 54 mL/min — ABNORMAL LOW (ref >60)
GFR calc non Af Amer: 47 mL/min — ABNORMAL LOW (ref >60)
GLUCOSE: 106 mg/dL — AB (ref 65–99)
Potassium: 3.7 mmol/L (ref 3.5–5.1)
Sodium: 139 mmol/L (ref 135–145)

## 2015-07-24 LAB — CBC
HEMATOCRIT: 34.4 % — AB (ref 35.0–47.0)
Hemoglobin: 11.7 g/dL — ABNORMAL LOW (ref 12.0–16.0)
MCH: 31.5 pg (ref 26.0–34.0)
MCHC: 33.9 g/dL (ref 32.0–36.0)
MCV: 93 fL (ref 80.0–100.0)
Platelets: 158 10*3/uL (ref 150–440)
RBC: 3.7 MIL/uL — ABNORMAL LOW (ref 3.80–5.20)
RDW: 13 % (ref 11.5–14.5)
WBC: 9.7 10*3/uL (ref 3.6–11.0)

## 2015-07-24 SURGERY — CYSTOSCOPY, WITH STENT INSERTION
Anesthesia: General | Laterality: Right

## 2015-07-24 MED ORDER — IPRATROPIUM-ALBUTEROL 0.5-2.5 (3) MG/3ML IN SOLN
RESPIRATORY_TRACT | Status: AC
  Filled 2015-07-24: qty 3

## 2015-07-24 MED ORDER — SUCCINYLCHOLINE CHLORIDE 20 MG/ML IJ SOLN
INTRAMUSCULAR | Status: DC | PRN
  Administered 2015-07-24: 100 mg via INTRAVENOUS

## 2015-07-24 MED ORDER — ONDANSETRON HCL 4 MG/2ML IJ SOLN
INTRAMUSCULAR | Status: DC | PRN
  Administered 2015-07-24: 4 mg via INTRAVENOUS

## 2015-07-24 MED ORDER — OXYCODONE HCL 5 MG/5ML PO SOLN: 5.0000 mg | Freq: Once | ORAL | Status: DC | PRN

## 2015-07-24 MED ORDER — LEVOFLOXACIN IN D5W 500 MG/100ML IV SOLN
INTRAVENOUS | Status: AC
  Administered 2015-07-24: 500 mg
  Filled 2015-07-24: qty 100

## 2015-07-24 MED ORDER — ALBUTEROL SULFATE HFA 108 (90 BASE) MCG/ACT IN AERS
INHALATION_SPRAY | RESPIRATORY_TRACT | Status: DC | PRN
  Administered 2015-07-24: 4 via RESPIRATORY_TRACT

## 2015-07-24 MED ORDER — POLYETHYLENE GLYCOL 3350 17 G PO PACK
17.0000 g | PACK | Freq: Every day | ORAL | Status: DC
  Administered 2015-07-24: 17 g via ORAL
  Filled 2015-07-24: qty 1

## 2015-07-24 MED ORDER — FENTANYL CITRATE (PF) 100 MCG/2ML IJ SOLN
25.0000 ug | INTRAMUSCULAR | Status: DC | PRN
  Administered 2015-07-24 (×2): 25 ug via INTRAVENOUS

## 2015-07-24 MED ORDER — FENTANYL CITRATE (PF) 100 MCG/2ML IJ SOLN
INTRAMUSCULAR | Status: AC
  Administered 2015-07-24: 25 ug via INTRAVENOUS
  Filled 2015-07-24: qty 2

## 2015-07-24 MED ORDER — DEXAMETHASONE SODIUM PHOSPHATE 10 MG/ML IJ SOLN
INTRAMUSCULAR | Status: DC | PRN
  Administered 2015-07-24: 10 mg via INTRAVENOUS

## 2015-07-24 MED ORDER — MIDAZOLAM HCL 2 MG/2ML IJ SOLN
INTRAMUSCULAR | Status: DC | PRN
  Administered 2015-07-24: 2 mg via INTRAVENOUS

## 2015-07-24 MED ORDER — ETOMIDATE 2 MG/ML IV SOLN
INTRAVENOUS | Status: DC | PRN
  Administered 2015-07-24: 30 mg via INTRAVENOUS

## 2015-07-24 MED ORDER — OXYCODONE HCL 5 MG PO TABS: 5.0000 mg | ORAL_TABLET | Freq: Once | ORAL | Status: DC | PRN

## 2015-07-24 MED ORDER — LEVOFLOXACIN IN D5W 500 MG/100ML IV SOLN: 500.0000 mg | Freq: Once | INTRAVENOUS | Status: DC

## 2015-07-24 MED ORDER — IPRATROPIUM-ALBUTEROL 0.5-2.5 (3) MG/3ML IN SOLN
3.0000 mL | Freq: Once | RESPIRATORY_TRACT | Status: DC | PRN
  Administered 2015-07-24: 3 mL via RESPIRATORY_TRACT

## 2015-07-24 MED ORDER — LIDOCAINE HCL (CARDIAC) 20 MG/ML IV SOLN
INTRAVENOUS | Status: DC | PRN
  Administered 2015-07-24: 100 mg via INTRAVENOUS

## 2015-07-24 MED ORDER — BISACODYL 10 MG RE SUPP
10.0000 mg | Freq: Every day | RECTAL | Status: DC
  Administered 2015-07-24: 10 mg via RECTAL
  Filled 2015-07-24 (×2): qty 1

## 2015-07-24 MED ORDER — LACTATED RINGERS IV SOLN
INTRAVENOUS | Status: DC | PRN
  Administered 2015-07-24: 12:00:00 via INTRAVENOUS

## 2015-07-24 SURGICAL SUPPLY — 23 items
BAG DRAIN CYSTO-URO LG1000N (MISCELLANEOUS) ×4 IMPLANT
CATH URETL 5X70 OPEN END (CATHETERS) ×8 IMPLANT
CONRAY 43 FOR UROLOGY 50M (MISCELLANEOUS) ×4 IMPLANT
GLOVE BIO SURGEON STRL SZ 6.5 (GLOVE) ×3 IMPLANT
GLOVE BIO SURGEON STRL SZ7 (GLOVE) ×8 IMPLANT
GLOVE BIO SURGEONS STRL SZ 6.5 (GLOVE) ×1
GOWN STRL REUS W/ TWL LRG LVL4 (GOWN DISPOSABLE) ×4 IMPLANT
GOWN STRL REUS W/TWL LRG LVL4 (GOWN DISPOSABLE) ×4
KIT RM TURNOVER CYSTO AR (KITS) ×4 IMPLANT
PACK CYSTO AR (MISCELLANEOUS) ×4 IMPLANT
PREP PVP WINGED SPONGE (MISCELLANEOUS) ×4 IMPLANT
SENSORWIRE 0.038 NOT ANGLED (WIRE) ×12
SET CYSTO W/LG BORE CLAMP LF (SET/KITS/TRAYS/PACK) ×4 IMPLANT
SOL .9 NS 3000ML IRR  AL (IV SOLUTION) ×2
SOL .9 NS 3000ML IRR UROMATIC (IV SOLUTION) ×2 IMPLANT
STENT CONTOUR 7FRX24 (STENTS) ×4 IMPLANT
STENT URET 6FRX24 CONTOUR (STENTS) ×4 IMPLANT
STENT URET 6FRX26 CONTOUR (STENTS) IMPLANT
STENT URETL CONTOUR 7X24 (Stent) ×4 IMPLANT
SURGILUBE 2OZ TUBE FLIPTOP (MISCELLANEOUS) ×4 IMPLANT
SYRINGE IRR TOOMEY STRL 70CC (SYRINGE) ×4 IMPLANT
WATER STERILE IRR 1000ML POUR (IV SOLUTION) ×4 IMPLANT
WIRE SENSOR 0.038 NOT ANGLED (WIRE) ×6 IMPLANT

## 2015-07-24 NOTE — Progress Notes (Signed)
Urology Consult Follow Up  Subjective: Incidentally noted to have fever to 101 yesterday evening, elevated WBC upon chart review this am (urology not called/ contacted).  UCx growing GNR.    Mild R flank pain.  Straining urine, has not seen stone.    In setting of fever and obstructing stone, recommend urgent right ureteral stent placement.  Currently NOT NPO.    Anti-infectives: Anti-infectives    Start     Dose/Rate Route Frequency Ordered Stop   07/23/15 1515  cephALEXin (KEFLEX) capsule 250 mg     250 mg Oral Every 12 hours 07/23/15 1505     07/23/15 1000  cefTRIAXone (ROCEPHIN) 1 g in dextrose 5 % 50 mL IVPB  Status:  Discontinued     1 g 100 mL/hr over 30 Minutes Intravenous Every 24 hours 07/22/15 1056 07/23/15 1537   07/22/15 0500  cefTRIAXone (ROCEPHIN) 1 g in dextrose 5 % 50 mL IVPB     1 g 100 mL/hr over 30 Minutes Intravenous STAT 07/22/15 0453 07/22/15 0754      Current Facility-Administered Medications  Medication Dose Route Frequency Provider Last Rate Last Dose  . acetaminophen (TYLENOL) tablet 650 mg  650 mg Oral Q6H PRN Adrian Saran, MD   650 mg at 07/23/15 2035   Or  . acetaminophen (TYLENOL) suppository 650 mg  650 mg Rectal Q6H PRN Adrian Saran, MD      . atorvastatin (LIPITOR) tablet 40 mg  40 mg Oral QPC supper Adrian Saran, MD   40 mg at 07/23/15 1716  . cephALEXin (KEFLEX) capsule 250 mg  250 mg Oral Q12H Delfino Lovett, MD   250 mg at 07/23/15 2030  . clorazepate (TRANXENE) tablet 3.75 mg  3.75 mg Oral Evette Cristal, MD   3.75 mg at 07/23/15 0803  . docusate sodium (COLACE) capsule 100 mg  100 mg Oral BID Delfino Lovett, MD   100 mg at 07/23/15 2029  . enoxaparin (LOVENOX) injection 40 mg  40 mg Subcutaneous Q24H Adrian Saran, MD   40 mg at 07/23/15 2029  . HYDROcodone-acetaminophen (NORCO/VICODIN) 5-325 MG per tablet 1-2 tablet  1-2 tablet Oral Q4H PRN Adrian Saran, MD   1 tablet at 07/23/15 0803  . lisinopril (PRINIVIL,ZESTRIL) tablet 20 mg  20 mg Oral Daily Adrian Saran, MD   20 mg at 07/24/15 0846  . metoprolol tartrate (LOPRESSOR) tablet 25 mg  25 mg Oral BID Adrian Saran, MD   25 mg at 07/23/15 2030  . morphine 2 MG/ML injection 1 mg  1 mg Intravenous Q4H PRN Adrian Saran, MD      . ondansetron (ZOFRAN) tablet 4 mg  4 mg Oral Q6H PRN Adrian Saran, MD       Or  . ondansetron (ZOFRAN) injection 4 mg  4 mg Intravenous Q6H PRN Adrian Saran, MD      . senna-docusate (Senokot-S) tablet 1 tablet  1 tablet Oral QHS PRN Adrian Saran, MD      . tamsulosin (FLOMAX) capsule 0.4 mg  0.4 mg Oral Daily Hildred Laser, MD   0.4 mg at 07/23/15 1610     Objective: Vital signs in last 24 hours: Temp:  [98.2 F (36.8 C)-101 F (38.3 C)] 98.2 F (36.8 C) (02/21 0639) Pulse Rate:  [64-75] 70 (02/20 2027) Resp:  [16-20] 20 (02/20 2026) BP: (109-151)/(43-86) 142/47 mmHg (02/21 0800) SpO2:  [96 %-100 %] 100 % (02/20 2027)  Intake/Output from previous day: 02/20 0701 - 02/21 0700 In: 1918.2 [P.O.:1600;  I.V.:318.2] Out: 1500 [Urine:1500] Intake/Output this shift:     Physical Exam  Constitutional: She is oriented to person, place, and time and well-developed, well-nourished, and in no distress.  Cardiovascular: Normal rate and regular rhythm.   Pulmonary/Chest: Effort normal.  Abdominal: Soft. There is no tenderness.  Genitourinary:  No significant CVA tenderness  Neurological: She is oriented to person, place, and time.  Skin: Skin is warm and dry.  Psychiatric: Affect and judgment normal.    Lab Results:   Recent Labs  07/23/15 0530 07/24/15 0523  WBC 11.4* 9.7  HGB 11.4* 11.7*  HCT 34.3* 34.4*  PLT 146* 158   BMET  Recent Labs  07/23/15 0530 07/24/15 0523  NA 137 139  K 3.8 3.7  CL 106 108  CO2 25 25  GLUCOSE 108* 106*  BUN 19 14  CREATININE 1.25* 1.15*  CALCIUM 8.2* 8.5*     Studies/Results:  2d ago    Specimen Description URINE, CLEAN CATCH   Special Requests Normal   Culture >=100,000 COLONIES/mL GRAM NEGATIVE RODS   IDENTIFICATION AND SUSCEPTIBILITIES TO FOLLOW             Assessment: 4 mm right distal ureteral stone, leukocytosis, +UCx, and fever to 101, otherwise hemodynamically stable.  GIven this clinical picture, high risk for GNR sepsis and recommend urgent drainage of kidney via ureteral stent.    Plan: -NPO -plan for emergent right ureteral stent placement, discussed risk/ benefits with patient along with need for definitive stone procedure -IV abx on call to OR in addition to PO abx  CC: Shah    LOS: 2 days    Vanna Scotland 07/24/2015

## 2015-07-24 NOTE — Anesthesia Preprocedure Evaluation (Signed)
Anesthesia Evaluation  Patient identified by MRN, date of birth, ID band Patient awake    Reviewed: Allergy & Precautions, H&P , NPO status , Patient's Chart, lab work & pertinent test results  Airway Mallampati: III  TM Distance: >3 FB Neck ROM: full    Dental  (+) Poor Dentition, Chipped, Missing, Partial Upper   Pulmonary neg shortness of breath, COPD, former smoker,    Pulmonary exam normal breath sounds clear to auscultation       Cardiovascular Exercise Tolerance: Good hypertension, (-) angina+ CAD, + Past MI and + CABG  (-) DOE Normal cardiovascular exam Rhythm:regular Rate:Normal     Neuro/Psych negative neurological ROS  negative psych ROS   GI/Hepatic negative GI ROS, Neg liver ROS,   Endo/Other  negative endocrine ROS  Renal/GU Renal disease  negative genitourinary   Musculoskeletal   Abdominal   Peds  Hematology negative hematology ROS (+)   Anesthesia Other Findings Past Medical History:   Hypertension                                                Past Surgical History:   CORONARY ARTERY BYPASS GRAFT                                 BMI    Body Mass Index   36.33 kg/m 2      Reproductive/Obstetrics negative OB ROS                             Anesthesia Physical Anesthesia Plan  ASA: III and emergent  Anesthesia Plan: General ETT and Rapid Sequence   Post-op Pain Management:    Induction:   Airway Management Planned:   Additional Equipment:   Intra-op Plan:   Post-operative Plan:   Informed Consent: I have reviewed the patients History and Physical, chart, labs and discussed the procedure including the risks, benefits and alternatives for the proposed anesthesia with the patient or authorized representative who has indicated his/her understanding and acceptance.   Dental Advisory Given  Plan Discussed with: Anesthesiologist, CRNA and  Surgeon  Anesthesia Plan Comments:         Anesthesia Quick Evaluation

## 2015-07-24 NOTE — Transfer of Care (Signed)
Immediate Anesthesia Transfer of Care Note  Patient: Molly Esparza  Procedure(s) Performed: Procedure(s): CYSTOSCOPY WITH STENT PLACEMENT (Right) URETEROSCOPY (N/A)  Patient Location: PACU  Anesthesia Type:General  Level of Consciousness: sedated  Airway & Oxygen Therapy: Patient Spontanous Breathing and Patient connected to face mask oxygen  Post-op Assessment: Report given to RN and Post -op Vital signs reviewed and stable  Post vital signs: Reviewed and stable  Last Vitals:  Filed Vitals:   07/24/15 0800 07/24/15 1251  BP: 142/47 161/52  Pulse:  95  Temp:  36.4 C  Resp:      Complications: No apparent anesthesia complications

## 2015-07-24 NOTE — Progress Notes (Signed)
[facility] - [place] at [facility]   PATIENT NAME: Molly Esparza    MR#:  [id number]  DATE OF BIRTH:  1945-08-30  SUBJECTIVE:  CHIEF COMPLAINT:   Chief Complaint  Patient presents with  . Emesis   feeling much better. Had emergent right ureteral stent placed as she spiked fever last evening.,  And was felt to be clinically worse.  Now she is requesting some mild laxative as she has not had a bowel movement REVIEW OF SYSTEMS:  Review of Systems  Constitutional: Negative for fever, weight loss, malaise/fatigue and diaphoresis.  HENT: Negative for ear discharge, ear pain, hearing loss, nosebleeds, sore throat and tinnitus.   Eyes: Negative for blurred vision and pain.  Respiratory: Negative for cough, hemoptysis, shortness of breath and wheezing.   Cardiovascular: Negative for chest pain, palpitations, orthopnea and leg swelling.  Gastrointestinal: Negative for heartburn, nausea, vomiting, abdominal pain, diarrhea, constipation and blood in stool.  Genitourinary: Negative for dysuria, urgency and frequency.  Musculoskeletal: Negative for myalgias and back pain.  Skin: Negative for itching and rash.  Neurological: Negative for dizziness, tingling, tremors, focal weakness, seizures, weakness and headaches.  Psychiatric/Behavioral: Negative for depression. The patient is not nervous/anxious.     DRUG ALLERGIES:   Allergies  Allergen Reactions  . Amoxicillin Other (See Comments)    Other Reaction: pt not sure if really allergic   VITALS:  Blood pressure 158/71, pulse 79, temperature 97.9 F (36.6 C), temperature source Oral, resp. rate 18, height  (1.676 m), weight 102.059 kg (225 lb), SpO2 95 %. PHYSICAL EXAMINATION:  Physical Exam  Constitutional: She is oriented to person, place, and time and well-developed, well-nourished, and in no distress.  HENT:  Head: Normocephalic and atraumatic.  Eyes: Conjunctivae and EOM are normal. Pupils are equal,  round, and reactive to light.  Neck: Normal range of motion. Neck supple. No tracheal deviation present. No thyromegaly present.  Cardiovascular: Normal rate, regular rhythm and normal heart sounds.   Pulmonary/Chest: Effort normal and breath sounds normal. No respiratory distress. She has no wheezes. She exhibits no tenderness.  Abdominal: Soft. Bowel sounds are normal. She exhibits no distension. There is no tenderness.  Musculoskeletal: Normal range of motion.  Neurological: She is alert and oriented to person, place, and time. No cranial nerve deficit.  Skin: Skin is warm and dry. No rash noted.  Psychiatric: Mood and affect normal.   LABORATORY PANEL:   CBC  Recent Labs Lab 07/24/15 0523  WBC 9.7  HGB 11.7*  HCT 34.4*  PLT 158   ------------------------------------------------------------------------------------------------------------------ Chemistries   Recent Labs Lab 07/22/15 0352  07/24/15 0523  NA 139  < > 139  K 3.7  < > 3.7  CL 103  < > 108  CO2 25  < > 25  GLUCOSE 117*  < > 106*  BUN 15  < > 14  CREATININE 1.32*  < > 1.15*  CALCIUM 9.3  < > 8.5*  AST 34  --   --   ALT 19  --   --   ALKPHOS 163*  --   --   BILITOT 0.6  --   --   < > = values in this interval not displayed. RADIOLOGY:  No results found. ASSESSMENT AND PLAN:   70 y/o female with HTN who has 4 mm stone at the right ureteropelvic junction causes moderate right hydronephrosis and significant right perinephric stranding on CT scan.  1. 4 mm stone at the right  ureteropelvic junction causes moderate right hydronephrosis and significant right perinephric stranding: - s/p emergent right ureteral stent placement by urology today - Patient feeling much better  2. Urinary tract infection with hematuria: Hematuria secondary to kidney stone.  Change  Her Rocephin to oral Keflex urine culture growing gram-negative rods, likely Escherichia coli Leukocytosis improved  WBC 3.5-11.4-9.7  3. Essential  hypertension: Continue metoprolol and lisinopril.   4. Acute kidney injury: Improved with IV hydration  5.  Constipation: Add stool softeners  All the records are reviewed and case discussed with nursing.     All the records are reviewed and case discussed with Care Management/Social Worker. Management plans discussed with the patient, family and they are in agreement.  CODE STATUS: Full Code  TOTAL TIME TAKING CARE OF THIS PATIENT: 35 minutes.   More than 50% of the time was spent in counseling/coordination of care: YES  POSSIBLE D/C IN a.m., DEPENDING ON CLINICAL CONDITION.   [facility], Evanny Ellerbe M.D on 07/24/2015 at 5:40 PM  Between 7am to 6pm - Pager - [phone]  After 6pm go to [email or web address] - password [email or web address] [facility]  [place] [facility]  Office  [phone]  CC: Primary care physician; BABAOFF, Lavada Mesi, MD  Note: This dictation was prepared with Dragon dictation along with smaller phrase technology. Any transcriptional errors that result from this process are unintentional.

## 2015-07-24 NOTE — OR Nursing (Signed)
Spoke with Ether Griffins MD regarding pt. Short of breath and wheezing , okay to send pt. To OR

## 2015-07-24 NOTE — Progress Notes (Signed)
Pt in room back from surgery. AAOX3 voice no complaints no pain. VS stable

## 2015-07-24 NOTE — Anesthesia Procedure Notes (Signed)
Procedure Name: Intubation Date/Time: 07/24/2015 12:25 PM Performed by: Stormy Fabian Pre-anesthesia Checklist: Patient identified, Emergency Drugs available, Suction available and Patient being monitored Patient Re-evaluated:Patient Re-evaluated prior to inductionOxygen Delivery Method: Circle system utilized Preoxygenation: Pre-oxygenation with 100% oxygen Intubation Type: IV induction, Cricoid Pressure applied and Rapid sequence Ventilation: Mask ventilation without difficulty Laryngoscope Size: Mac and 3 Grade View: Grade II Tube type: Oral Tube size: 7.0 mm Number of attempts: 1 Airway Equipment and Method: Stylet Placement Confirmation: ETT inserted through vocal cords under direct vision,  positive ETCO2 and breath sounds checked- equal and bilateral Secured at: 21 cm Tube secured with: Tape Dental Injury: Teeth and Oropharynx as per pre-operative assessment

## 2015-07-24 NOTE — Op Note (Signed)
Date of procedure: 07/24/2015  Preoperative diagnosis:  1. Right UVJ stone hydronephrosis 2.  Fevers 3. Urinary tract infection  Postoperative diagnosis:  1. Same as above  Procedure: 1. Cystoscopy 2. Right ureteral stent placement  Surgeon: Vanna Scotland, MD  Anesthesia: General  Complications: None  Intraoperative findings: 4 mm right distal stone identified on fluoroscopy. Significant debris and inflammation in the bladder.  EBL: minimal  Specimens: none  Drains: 6 x 24 Fr JJ ureteral stent on right  Indication: Molly Esparza is a 70 y.o. patient with obstructing right 4 mm distal ureteral calculus, fevers, leukocytosis, and urinary tract infection.  After reviewing the management options for treatment, she elected to proceed with the above surgical procedure(s). We have discussed the potential benefits and risks of the procedure, side effects of the proposed treatment, the likelihood of the patient achieving the goals of the procedure, and any potential problems that might occur during the procedure or recuperation. Informed consent has been obtained.  Description of procedure:  The patient was taken to the operating room and general anesthesia was induced.  The patient was placed in the dorsal lithotomy position, prepped and draped in the usual sterile fashion, and preoperative antibiotics (Levaquin) were administered. A preoperative time-out was performed.   At this point in time, a rigid 21 French cystoscope was advanced per urethra into the bladder. The bladder was noted to be fairly inflamed with erythematous mucosa and significant debris consistent with cystitis. Attention was turned to the right ureteral orifice which was cannulated using a 5 [place] open-ended ureteral catheter along with a sensor wire.  The 4 mm distal ureteral stone could be seen on fluoroscopy. The wire was able to be placed up to the level of the kidney without difficulty. A 6 x 24 French double-J  ureteral stent was then advanced over the wire up to level of the kidney. The wire was partially withdrawn until coil was noted within the renal pelvis. The wire was then fully withdrawn and a coil was noted within the bladder. The bladder was then drained. She was cleaned and dried, repositioned in the supine position, and reversed from anesthesia. She was taken to the PACU in stable condition.  Plan: Patient will return to the floor and may resume her diet. We will follow up her urine culture and adjust antibiotics as needed.  She will need to follow up in our office for discussion of definitive stone procedure. Her stone will likely be able to be followed with KUB given its presence on fluoroscopy today.  Vanna Scotland, M.D.

## 2015-07-24 NOTE — Progress Notes (Signed)
Duo neb given for SOB  And upper resp wheezing  resp rate 34

## 2015-07-24 NOTE — Anesthesia Postprocedure Evaluation (Signed)
Anesthesia Post Note  Patient: Molly Esparza  Procedure(s) Performed: Procedure(s) (LRB): CYSTOSCOPY WITH STENT PLACEMENT (Right) URETEROSCOPY (N/A)  Patient location during evaluation: PACU Anesthesia Type: General Level of consciousness: awake and alert Pain management: pain level controlled Vital Signs Assessment: post-procedure vital signs reviewed and stable Respiratory status: spontaneous breathing, nonlabored ventilation, respiratory function stable and patient connected to nasal cannula oxygen Cardiovascular status: blood pressure returned to baseline and stable Postop Assessment: no signs of nausea or vomiting Anesthetic complications: no    Last Vitals:  Filed Vitals:   07/24/15 1335 07/24/15 1352  BP: 131/65 158/71  Pulse: 77 79  Temp:  36.6 C  Resp: 14 18    Last Pain:  Filed Vitals:   07/24/15 1414  PainSc: 0-No pain                 Cleda Mccreedy Piscitello

## 2015-07-25 ENCOUNTER — Encounter: Payer: Self-pay | Admitting: Urology

## 2015-07-25 DIAGNOSIS — R109 Unspecified abdominal pain: Secondary | ICD-10-CM

## 2015-07-25 DIAGNOSIS — N200 Calculus of kidney: Secondary | ICD-10-CM

## 2015-07-25 DIAGNOSIS — N12 Tubulo-interstitial nephritis, not specified as acute or chronic: Secondary | ICD-10-CM

## 2015-07-25 LAB — CBC
HCT: 33.2 % — ABNORMAL LOW (ref 35.0–47.0)
HEMOGLOBIN: 11.2 g/dL — AB (ref 12.0–16.0)
MCH: 31.7 pg (ref 26.0–34.0)
MCHC: 33.8 g/dL (ref 32.0–36.0)
MCV: 93.8 fL (ref 80.0–100.0)
Platelets: 173 10*3/uL (ref 150–440)
RBC: 3.54 MIL/uL — ABNORMAL LOW (ref 3.80–5.20)
RDW: 12.9 % (ref 11.5–14.5)
WBC: 7.6 10*3/uL (ref 3.6–11.0)

## 2015-07-25 LAB — BASIC METABOLIC PANEL
Anion gap: 9 (ref 5–15)
BUN: 14 mg/dL (ref 6–20)
CHLORIDE: 105 mmol/L (ref 101–111)
CO2: 22 mmol/L (ref 22–32)
CREATININE: 0.84 mg/dL (ref 0.44–1.00)
Calcium: 8.9 mg/dL (ref 8.9–10.3)
GFR calc Af Amer: >60 mL/min (ref >60)
GFR calc non Af Amer: >60 mL/min (ref >60)
Glucose, Bld: 134 mg/dL — ABNORMAL HIGH (ref 65–99)
Potassium: 3.9 mmol/L (ref 3.5–5.1)
SODIUM: 136 mmol/L (ref 135–145)

## 2015-07-25 LAB — URINE CULTURE
Culture: >=100000
Special Requests: NORMAL

## 2015-07-25 MED ORDER — TAMSULOSIN HCL 0.4 MG PO CAPS: 0.4000 mg | ORAL_CAPSULE | Freq: Every day | ORAL | Status: DC

## 2015-07-25 MED ORDER — OXYBUTYNIN CHLORIDE ER 5 MG PO TB24: 5.0000 mg | ORAL_TABLET | Freq: Three times a day (TID) | ORAL | Status: DC | PRN

## 2015-07-25 MED ORDER — CEPHALEXIN 250 MG PO CAPS: 250.0000 mg | ORAL_CAPSULE | Freq: Two times a day (BID) | ORAL | Status: DC

## 2015-07-25 NOTE — Progress Notes (Signed)
Pt stable. IV removed. D/c instructions given and education provided. Prescriptions sent to pharmacy electronically. Pt states she understands instructions. Pt dressed and escorted out by staff. Driven home by family.

## 2015-07-25 NOTE — Discharge Instructions (Signed)
You have a ureteral stent in place.  This is a tube that extends from your kidney to your bladder.  This may cause urinary bleeding, burning with urination, and urinary frequency.  Please call our office or present to the ED if you develop fevers >101 or pain which is not able to be controlled with oral pain medications.  You may be given either Flomax and/ or ditropan to help with bladder spasms and stent pain in addition to pain medications.    [facility] [address] [place], [place] [address] [phone]  Kidney Stones Kidney stones (urolithiasis) are solid masses that form inside your kidneys. The intense pain is caused by the stone moving through the kidney, ureter, bladder, and urethra (urinary tract). When the stone moves, the ureter starts to spasm around the stone. The stone is usually passed in your pee (urine).  HOME CARE  Drink enough fluids to keep your pee clear or pale yellow. This helps to get the stone out.  Take a 24-hour pee (urine) sample as told by your doctor. You may need to take another sample every 6-12 months.  Strain all pee through the provided strainer. Do not pee without peeing through the strainer, not even once. If you pee the stone out, catch it in the strainer. The stone may be as small as a grain of salt. Take this to your doctor. This will help your doctor figure out what you can do to try to prevent more kidney stones.  Only take medicine as told by your doctor.  Make changes to your daily diet as told by your doctor. You may be told to:  Limit how much salt you eat.  Eat 5 or more servings of fruits and vegetables each day.  Limit how much meat, poultry, fish, and eggs you eat.  Keep all follow-up visits as told by your doctor. This is important.  Get follow-up X-rays as told by your doctor. GET HELP IF: You have pain that gets worse even if you have been taking pain medicine. GET HELP RIGHT AWAY IF:    Your pain does not get better with medicine.  You have a fever or shaking chills.  Your pain increases and gets worse over 18 hours.  You have new belly (abdominal) pain.  You feel faint or pass out.  You are unable to pee.   This information is not intended to replace advice given to you by your health care provider. Make sure you discuss any questions you have with your health care provider.   Document Released: 11/05/2007 Document Revised: 02/07/2015 Document Reviewed: 10/20/2012 Elsevier Interactive Patient Education [employer]

## 2015-07-25 NOTE — Progress Notes (Signed)
Urology Consult Follow Up  Subjective: Feels better s/p stent.  No further fevers.  UCx GNR, final pending.    Anti-infectives: Anti-infectives    Start     Dose/Rate Route Frequency Ordered Stop   07/24/15 1215  levofloxacin (LEVAQUIN) IVPB 500 mg     500 mg 100 mL/hr over 60 Minutes Intravenous  Once 07/24/15 1201     07/24/15 1208  levofloxacin (LEVAQUIN) 500 MG/100ML IVPB    Comments:  JOYCE, HEATHER: cabinet override      07/24/15 1208 07/24/15 1210   07/23/15 1515  cephALEXin (KEFLEX) capsule 250 mg     250 mg Oral Every 12 hours 07/23/15 1505     07/23/15 1000  cefTRIAXone (ROCEPHIN) 1 g in dextrose 5 % 50 mL IVPB  Status:  Discontinued     1 g 100 mL/hr over 30 Minutes Intravenous Every 24 hours 07/22/15 1056 07/23/15 1537   07/22/15 0500  cefTRIAXone (ROCEPHIN) 1 g in dextrose 5 % 50 mL IVPB     1 g 100 mL/hr over 30 Minutes Intravenous STAT 07/22/15 0453 07/22/15 0754      Current Facility-Administered Medications  Medication Dose Route Frequency Provider Last Rate Last Dose  . acetaminophen (TYLENOL) tablet 650 mg  650 mg Oral Q6H PRN Adrian Saran, MD   650 mg at 07/25/15 0819   Or  . acetaminophen (TYLENOL) suppository 650 mg  650 mg Rectal Q6H PRN Adrian Saran, MD      . atorvastatin (LIPITOR) tablet 40 mg  40 mg Oral QPC supper Adrian Saran, MD   40 mg at 07/24/15 1907  . bisacodyl (DULCOLAX) suppository 10 mg  10 mg Rectal Daily Delfino Lovett, MD   10 mg at 07/24/15 1907  . cephALEXin (KEFLEX) capsule 250 mg  250 mg Oral Q12H Delfino Lovett, MD   250 mg at 07/24/15 2148  . clorazepate (TRANXENE) tablet 3.75 mg  3.75 mg Oral Evette Cristal, MD   3.75 mg at 07/25/15 0600  . docusate sodium (COLACE) capsule 100 mg  100 mg Oral BID Delfino Lovett, MD   100 mg at 07/24/15 2149  . enoxaparin (LOVENOX) injection 40 mg  40 mg Subcutaneous Q24H Adrian Saran, MD   40 mg at 07/24/15 2149  . HYDROcodone-acetaminophen (NORCO/VICODIN) 5-325 MG per tablet 1-2 tablet  1-2 tablet Oral Q4H PRN Adrian Saran, MD   1 tablet at 07/23/15 0803  . levofloxacin (LEVAQUIN) IVPB 500 mg  500 mg Intravenous Once Vanna Scotland, MD      . lisinopril (PRINIVIL,ZESTRIL) tablet 20 mg  20 mg Oral Daily Adrian Saran, MD   20 mg at 07/24/15 0846  . metoprolol tartrate (LOPRESSOR) tablet 25 mg  25 mg Oral BID Adrian Saran, MD   25 mg at 07/24/15 2148  . ondansetron (ZOFRAN) tablet 4 mg  4 mg Oral Q6H PRN Adrian Saran, MD       Or  . ondansetron (ZOFRAN) injection 4 mg  4 mg Intravenous Q6H PRN Sital Mody, MD      . polyethylene glycol (MIRALAX / GLYCOLAX) packet 17 g  17 g Oral Daily Delfino Lovett, MD   17 g at 07/24/15 1907  . senna-docusate (Senokot-S) tablet 1 tablet  1 tablet Oral QHS PRN Adrian Saran, MD      . tamsulosin (FLOMAX) capsule 0.4 mg  0.4 mg Oral Daily Hildred Laser, MD   0.4 mg at 07/23/15 0981     Objective: Vital signs in last 24 hours:  Temp:  [97.6 F (36.4 C)-98.9 F (37.2 C)] 98.1 F (36.7 C) (02/21 2121) Pulse Rate:  [67-95] 78 (02/21 2121) Resp:  [14-32] 20 (02/21 2121) BP: (131-170)/(52-87) 149/78 mmHg (02/21 2124) SpO2:  [95 %-99 %] 97 % (02/21 2121)  Intake/Output from previous day: 02/21 0701 - 02/22 0700 In: 540 [P.O.:240; I.V.:300] Out: 100 [Urine:100] Intake/Output this shift:     Physical Exam  Constitutional: She is oriented to person, place, and time and well-developed, well-nourished, and in no distress.  Cardiovascular: Normal rate and regular rhythm.   Pulmonary/Chest: Effort normal.  Abdominal: Soft. There is no tenderness.  Genitourinary:  No significant CVA tenderness  Neurological: She is oriented to person, place, and time.  Skin: Skin is warm and dry.  Psychiatric: Affect and judgment normal.    Lab Results:   Recent Labs  07/24/15 0523 07/25/15 0629  WBC 9.7 7.6  HGB 11.7* 11.2*  HCT 34.4* 33.2*  PLT 158 173   BMET  Recent Labs  07/24/15 0523 07/25/15 0629  NA 139 136  K 3.7 3.9  CL 108 105  CO2 25 22  GLUCOSE 106* 134*  BUN 14  14  CREATININE 1.15* 0.84  CALCIUM 8.5* 8.9     Studies/Results:  3d ago    Specimen Description URINE, CLEAN CATCH   Special Requests Normal   Culture >=100,000 COLONIES/mL GRAM NEGATIVE RODS  IDENTIFICATION AND SUSCEPTIBILITIES TO FOLLOW             Assessment: 4 mm right distal ureteral stone, leukocytosis, +UCx, and fever to 101 POD 1 s/p right ureteral stent placement  Plan: -d/c home with PO abx, f/u culture and sensitivity data  -discharge scripts to include ditropan 5 mg po tid prn bladder spasms and flomax daily for stent pain  -f/u 1-2 weeks with Urology to discuss definitive management of stone   CC: Shah    LOS: 3 days    Vanna Scotland 07/25/2015

## 2015-07-25 NOTE — Progress Notes (Signed)
Urology Consult Follow Up  Subjective: Patient underwent right ureteral stent placement yesterday for an elevation in temperate and WBC's.  UCx results still pending.  Patient is without complaints today.  She is requesting regular diet this morning.  She is not experiencing stent discomfort.  Cr is normal.    Anti-infectives: Anti-infectives    Start     Dose/Rate Route Frequency Ordered Stop   07/24/15 1215  levofloxacin (LEVAQUIN) IVPB 500 mg     500 mg 100 mL/hr over 60 Minutes Intravenous  Once 07/24/15 1201     07/24/15 1208  levofloxacin (LEVAQUIN) 500 MG/100ML IVPB    Comments:  JOYCE, HEATHER: cabinet override      07/24/15 1208 07/24/15 1210   07/23/15 1515  cephALEXin (KEFLEX) capsule 250 mg     250 mg Oral Every 12 hours 07/23/15 1505     07/23/15 1000  cefTRIAXone (ROCEPHIN) 1 g in dextrose 5 % 50 mL IVPB  Status:  Discontinued     1 g 100 mL/hr over 30 Minutes Intravenous Every 24 hours 07/22/15 1056 07/23/15 1537   07/22/15 0500  cefTRIAXone (ROCEPHIN) 1 g in dextrose 5 % 50 mL IVPB     1 g 100 mL/hr over 30 Minutes Intravenous STAT 07/22/15 0453 07/22/15 0754      Current Facility-Administered Medications  Medication Dose Route Frequency Provider Last Rate Last Dose  . acetaminophen (TYLENOL) tablet 650 mg  650 mg Oral Q6H PRN Adrian Saran, MD   650 mg at 07/25/15 0819   Or  . acetaminophen (TYLENOL) suppository 650 mg  650 mg Rectal Q6H PRN Adrian Saran, MD      . atorvastatin (LIPITOR) tablet 40 mg  40 mg Oral QPC supper Adrian Saran, MD   40 mg at 07/24/15 1907  . bisacodyl (DULCOLAX) suppository 10 mg  10 mg Rectal Daily Delfino Lovett, MD   10 mg at 07/24/15 1907  . cephALEXin (KEFLEX) capsule 250 mg  250 mg Oral Q12H Delfino Lovett, MD   250 mg at 07/24/15 2148  . clorazepate (TRANXENE) tablet 3.75 mg  3.75 mg Oral Evette Cristal, MD   3.75 mg at 07/25/15 0600  . docusate sodium (COLACE) capsule 100 mg  100 mg Oral BID Delfino Lovett, MD   100 mg at 07/24/15 2149  .  enoxaparin (LOVENOX) injection 40 mg  40 mg Subcutaneous Q24H Adrian Saran, MD   40 mg at 07/24/15 2149  . HYDROcodone-acetaminophen (NORCO/VICODIN) 5-325 MG per tablet 1-2 tablet  1-2 tablet Oral Q4H PRN Adrian Saran, MD   1 tablet at 07/23/15 0803  . levofloxacin (LEVAQUIN) IVPB 500 mg  500 mg Intravenous Once Vanna Scotland, MD      . lisinopril (PRINIVIL,ZESTRIL) tablet 20 mg  20 mg Oral Daily Adrian Saran, MD   20 mg at 07/24/15 0846  . metoprolol tartrate (LOPRESSOR) tablet 25 mg  25 mg Oral BID Adrian Saran, MD   25 mg at 07/24/15 2148  . ondansetron (ZOFRAN) tablet 4 mg  4 mg Oral Q6H PRN Adrian Saran, MD       Or  . ondansetron (ZOFRAN) injection 4 mg  4 mg Intravenous Q6H PRN Sital Mody, MD      . polyethylene glycol (MIRALAX / GLYCOLAX) packet 17 g  17 g Oral Daily Delfino Lovett, MD   17 g at 07/24/15 1907  . senna-docusate (Senokot-S) tablet 1 tablet  1 tablet Oral QHS PRN Adrian Saran, MD      . tamsulosin (  FLOMAX) capsule 0.4 mg  0.4 mg Oral Daily Hildred Laser, MD   0.4 mg at 07/23/15 1610     Objective: Vital signs in last 24 hours: Temp:  [97.6 F (36.4 C)-98.9 F (37.2 C)] 98.1 F (36.7 C) (02/21 2121) Pulse Rate:  [67-95] 78 (02/21 2121) Resp:  [14-32] 20 (02/21 2121) BP: (131-170)/(52-87) 149/78 mmHg (02/21 2124) SpO2:  [95 %-99 %] 97 % (02/21 2121)  Intake/Output from previous day: 02/21 0701 - 02/22 0700 In: 540 [P.O.:240; I.V.:300] Out: 100 [Urine:100] Intake/Output this shift:     Physical Exam Constitutional: Well nourished. Alert and oriented, No acute distress. HEENT: [place] AT, moist mucus membranes. Trachea midline, no masses. Cardiovascular: No clubbing, cyanosis, or edema. Respiratory: Normal respiratory effort, no increased work of breathing. GI: Abdomen is soft, non tender, non distended, no abdominal masses. Liver and spleen not palpable.   GU: No CVA tenderness.  No bladder fullness or masses.   Skin: No rashes, bruises or suspicious lesions. Lymph: No  cervical or inguinal adenopathy. Neurologic: Grossly intact, no focal deficits, moving all 4 extremities. Psychiatric: Normal mood and affect.  Lab Results:   Recent Labs  07/24/15 0523 07/25/15 0629  WBC 9.7 7.6  HGB 11.7* 11.2*  HCT 34.4* 33.2*  PLT 158 173   BMET  Recent Labs  07/24/15 0523 07/25/15 0629  NA 139 136  K 3.7 3.9  CL 108 105  CO2 25 22  GLUCOSE 106* 134*  BUN 14 14  CREATININE 1.15* 0.84  CALCIUM 8.5* 8.9   PT/INR No results for input(s): LABPROT, INR in the last 72 hours. ABG No results for input(s): PHART, HCO3 in the last 72 hours.  Invalid input(s): PCO2, PO2  Studies/Results: No results found.   Assessment: s/p Procedure(s): CYSTOSCOPY WITH STENT PLACEMENT URETEROSCOPY Patient with a right obstructing stone with fever/leukocytosis and UCx positive for GNR.   Plan: See Dr. Delana Meyer note.    CC:     LOS: 3 days    [facility] 07/25/2015

## 2015-07-28 NOTE — Discharge Summary (Signed)
[facility] [place] [facility]   PATIENT NAME: Molly Esparza    MR#:  [id number]  DATE OF BIRTH:  [birth year]  DATE OF ADMISSION:  07/22/2015 ADMITTING PHYSICIAN: Adrian Saran, MD  DATE OF DISCHARGE: 07/25/2015 12:30 PM  PRIMARY CARE PHYSICIAN: BABAOFF, MARC E, MD    ADMISSION DIAGNOSIS:  Kidney stone [N20.0] Pyelonephritis [N12] Right flank pain [R10.9]  DISCHARGE DIAGNOSIS:  Active Problems:   Kidney stone   Pyelonephritis   Right flank pain   SECONDARY DIAGNOSIS:   Past Medical History  Diagnosis Date  . Hypertension     HOSPITAL COURSE:  70 y.o. female with a known history of HTN who was admitted with back pain and nausea. She had CT scan which showed 4 mm stone at the right ureteropelvic junction with moderate right hydronephrosis. Please see Dr Camillia Herter dictated H & P for further details. Urology c/s was obtained with Dr Apolinar Junes.  1. 4 mm stone at the right ureteropelvic junction causes moderate right hydronephrosis and significant right perinephric stranding: - s/p emergent right ureteral stent placement by urology on 2/21 due to clinical condition worsening - Patient feeling much better afterwords  2. E.coli Urinary tract infection with hematuria: Hematuria secondary to kidney stone. UTI treated with appropriate abx.  3. Essential hypertension: Controlled with metoprolol and lisinopril.   4. Acute kidney injury: Improved with IV hydration  5. Constipation: resolved with stool softeners  Patient was feeling much better by 2/22 and was D/C home in stable condition. She was agreeable with D/C plans. DISCHARGE CONDITIONS:   stable  CONSULTS OBTAINED:  Treatment Team:  Hildred Laser, MD  DRUG ALLERGIES:   Allergies  Allergen Reactions  . Amoxicillin Other (See Comments)    Other Reaction: pt not sure if really allergic    DISCHARGE MEDICATIONS:   Discharge Medication List as of 07/25/2015 11:44 AM    START taking  these medications   Details  cephALEXin (KEFLEX) 250 MG capsule Take 1 capsule (250 mg total) by mouth every 12 (twelve) hours., Starting 07/25/2015, Until Discontinued, Normal    oxybutynin (DITROPAN XL) 5 MG 24 hr tablet Take 1 tablet (5 mg total) by mouth 3 (three) times daily as needed., Starting 07/25/2015, Until Discontinued, Normal    tamsulosin (FLOMAX) 0.4 MG CAPS capsule Take 1 capsule (0.4 mg total) by mouth daily., Starting 07/25/2015, Until Discontinued, Normal      CONTINUE these medications which have NOT CHANGED   Details  aspirin EC 81 MG tablet Take 81 mg by mouth every morning., Until Discontinued, Historical Med    atorvastatin (LIPITOR) 40 MG tablet Take 1 tablet by mouth every morning. , Starting 07/03/2015, Until Discontinued, Historical Med    Calcium-Magnesium-Vitamin D (CALCIUM 500 PO) Take 500 mg by mouth 2 (two) times daily., Until Discontinued, Historical Med    clorazepate (TRANXENE) 3.75 MG tablet Take 3.75 mg by mouth every morning., Until Discontinued, Historical Med    furosemide (LASIX) 20 MG tablet Take 1 tablet by mouth daily., Starting 07/03/2015, Until Discontinued, Historical Med    lisinopril (PRINIVIL,ZESTRIL) 20 MG tablet Take 1 tablet by mouth daily., Starting 07/03/2015, Until Discontinued, Historical Med    metoprolol tartrate (LOPRESSOR) 25 MG tablet Take 25 mg by mouth 2 (two) times daily., Until Discontinued, Historical Med    Multiple Vitamin (MULTIVITAMIN) tablet Take 1 tablet by mouth daily., Until Discontinued, Historical Med         DISCHARGE INSTRUCTIONS:    DIET:  Regular  diet  DISCHARGE CONDITION:  Good  ACTIVITY:  Activity as tolerated  OXYGEN:  Home Oxygen: No.   Oxygen Delivery: room air  DISCHARGE LOCATION:  home   If you experience worsening of your admission symptoms, develop shortness of breath, life threatening emergency, suicidal or homicidal thoughts you must seek medical attention immediately by calling  [phone] or calling your MD immediately  if symptoms less severe.  You Must read complete instructions/literature along with all the possible adverse reactions/side effects for all the Medicines you take and that have been prescribed to you. Take any new Medicines after you have completely understood and accpet all the possible adverse reactions/side effects.   Please note  You were cared for by a hospitalist during your hospital stay. If you have any questions about your discharge medications or the care you received while you were in the hospital after you are discharged, you can call the unit and asked to speak with the hospitalist on call if the hospitalist that took care of you is not available. Once you are discharged, your primary care physician will handle any further medical issues. Please note that NO REFILLS for any discharge medications will be authorized once you are discharged, as it is imperative that you return to your primary care physician (or establish a relationship with a primary care physician if you do not have one) for your aftercare needs so that they can reassess your need for medications and monitor your lab values.    On the day of Discharge:  VITAL SIGNS:  Blood pressure 149/78, pulse 78, temperature 98.1 F (36.7 C), temperature source Oral, resp. rate 20, height 5\' 6"  (1.676 m), weight 102.059 kg (225 lb), SpO2 97 %.  PHYSICAL EXAMINATION:  GENERAL:  70 y.o.-year-old patient lying in the bed with no acute distress.  EYES: Pupils equal, round, reactive to light and accommodation. No scleral icterus. Extraocular muscles intact.  HEENT: Head atraumatic, normocephalic. Oropharynx and nasopharynx clear.  NECK:  Supple, no jugular venous distention. No thyroid enlargement, no tenderness.  LUNGS: Normal breath sounds bilaterally, no wheezing, rales,rhonchi or crepitation. No use of accessory muscles of respiration.  CARDIOVASCULAR: S1, S2 normal. No murmurs, rubs, or gallops.   ABDOMEN: Soft, non-tender, non-distended. Bowel sounds present. No organomegaly or mass.  EXTREMITIES: No pedal edema, cyanosis, or clubbing.  NEUROLOGIC: Cranial nerves II through XII are intact. Muscle strength 5/5 in all extremities. Sensation intact. Gait not checked.  PSYCHIATRIC: The patient is alert and oriented x 3.  SKIN: No obvious rash, lesion, or ulcer.  DATA REVIEW:   CBC  Recent Labs Lab 07/25/15 0629  WBC 7.6  HGB 11.2*  HCT 33.2*  PLT 173    Chemistries   Recent Labs Lab 07/22/15 0352  07/25/15 0629  NA 139  < > 136  K 3.7  < > 3.9  CL 103  < > 105  CO2 25  < > 22  GLUCOSE 117*  < > 134*  BUN 15  < > 14  CREATININE 1.32*  < > 0.84  CALCIUM 9.3  < > 8.9  AST 34  --   --   ALT 19  --   --   ALKPHOS 163*  --   --   BILITOT 0.6  --   --   < > = values in this interval not displayed.  Cardiac Enzymes  Recent Labs Lab 07/22/15 0352  TROPONINI 0.03    Microbiology Results  Results for orders placed or  performed during the hospital encounter of 07/22/15  Urine culture     Status: None   Collection Time: 07/22/15  4:28 AM  Result Value Ref Range Status   Specimen Description URINE, CLEAN CATCH  Final   Special Requests Normal  Final   Culture >=100,000 COLONIES/mL ESCHERICHIA COLI  Final   Report Status 07/25/2015 FINAL  Final   Organism ID, Bacteria ESCHERICHIA COLI  Final      Susceptibility   Escherichia coli - MIC*    AMPICILLIN <=2 SENSITIVE Sensitive     CEFAZOLIN <=4 SENSITIVE Sensitive     CEFTRIAXONE <=1 SENSITIVE Sensitive     CIPROFLOXACIN <=0.25 SENSITIVE Sensitive     GENTAMICIN <=1 SENSITIVE Sensitive     IMIPENEM <=0.25 SENSITIVE Sensitive     NITROFURANTOIN <=16 SENSITIVE Sensitive     TRIMETH/SULFA <=20 SENSITIVE Sensitive     AMPICILLIN/SULBACTAM <=2 SENSITIVE Sensitive     PIP/TAZO <=4 SENSITIVE Sensitive     Extended ESBL NEGATIVE Sensitive     * >=100,000 COLONIES/mL ESCHERICHIA COLI    RADIOLOGY:  No results  found.   Management plans discussed with the patient, family and they are in agreement.  CODE STATUS:  Code Status History    Date Active Date Inactive Code Status Order ID Comments User Context   07/22/2015 12:26 PM 07/25/2015  3:46 PM Full Code [id number]  Adrian Saran, MD Inpatient      TOTAL TIME TAKING CARE OF THIS PATIENT: 45 minutes.    [facility], Jo-Ann Johanning M.D on 07/28/2015 at 8:30 AM  Between 7am to 6pm - Pager - [phone]  After 6pm go to [email or web address] - password [email or web address] [facility]  [facility]  Office  [phone]  CC: Primary care physician; BABAOFF, Lavada Mesi, MD Vanna Scotland, MD  Note: This dictation was prepared with Dragon dictation along with smaller phrase technology. Any transcriptional errors that result from this process are unintentional.

## 2015-08-01 ENCOUNTER — Ambulatory Visit
Admission: RE | Admit: 2015-08-01 | Discharge: 2015-08-01 | Disposition: A | Discharge status: Home / Self Care | Payer: Commercial Managed Care - HMO | Source: Ambulatory Visit | Attending: Urology | Admitting: Urology

## 2015-08-01 ENCOUNTER — Ambulatory Visit (INDEPENDENT_AMBULATORY_CARE_PROVIDER_SITE_OTHER): Payer: Commercial Managed Care - HMO | Admitting: Urology

## 2015-08-01 ENCOUNTER — Encounter: Payer: Self-pay | Admitting: Urology

## 2015-08-01 VITALS — BP 112/72 | HR 83 | Ht 66.0 in | Wt 228.1 lb

## 2015-08-01 DIAGNOSIS — N200 Calculus of kidney: Secondary | ICD-10-CM | POA: Insufficient documentation

## 2015-08-01 DIAGNOSIS — Z466 Encounter for fitting and adjustment of urinary device: Secondary | ICD-10-CM | POA: Diagnosis not present

## 2015-08-01 DIAGNOSIS — N132 Hydronephrosis with renal and ureteral calculous obstruction: Secondary | ICD-10-CM | POA: Diagnosis not present

## 2015-08-01 DIAGNOSIS — R3129 Other microscopic hematuria: Secondary | ICD-10-CM

## 2015-08-01 LAB — URINALYSIS, COMPLETE
Bilirubin, UA: NEGATIVE
Glucose, UA: NEGATIVE
Ketones, UA: NEGATIVE
Nitrite, UA: NEGATIVE
PH UA: 7 (ref 5.0–7.5)
PROTEIN UA: NEGATIVE
Specific Gravity, UA: 1.01 (ref 1.005–1.030)
UUROB: 0.2 mg/dL (ref 0.2–1.0)

## 2015-08-01 LAB — MICROSCOPIC EXAMINATION: BACTERIA UA: NONE SEEN

## 2015-08-01 NOTE — Progress Notes (Signed)
08/01/2015 8:23 AM   Steward Ros 04-14-1946 [id number]  Referring provider: Derinda Late, MD [phone] [address] [facility] [place], [place] [address]  Chief Complaint  Patient presents with  . Nephrolithiasis    Follow up 1 week after Cystoscopy and stent placement and KUB this am    HPI: Patient is a 70 year old Caucasian female who presents today for follow-up after hospital admission.  Patient presented to the emergency room on 07/22/2015 complaining right flank pain.  A CT scan noted a 4 mm stone at the right ureteropelvic junction causing moderate right hydronephrosis and significant right perinephric stranding. She was admitted for IV fluids, pain medications and antibiotics.  During her hospitalization she did spike a fever and underwent emergent right ureteral stent placement for decompression.  The rest of her hospital stay was uneventful and she was discharged to home and instructed to follow up with [place].  Since her discharge, she has been nonsymptomatic.  She is not having any difficulty with the stent.  She is not experiencing gross hematuria, fevers/chills, nausea or vomiting.  Today her UA is nitrite negative.   Her KUB taken this morning demonstrates the right ureteral stent is in good position.  The right proximal stone has now moved into the right lower pole.     PMH: Past Medical History  Diagnosis Date  . Hypertension   . Kidney stone   . Generalized anxiety disorder   . PVD (peripheral vascular disease) ([place])   . Coronary atherosclerosis of native coronary artery   . HLD (hyperlipidemia)     Surgical History: Past Surgical History  Procedure Laterality Date  . Coronary artery bypass graft    . Cystoscopy with stent placement Right 07/24/2015    Procedure: CYSTOSCOPY WITH STENT PLACEMENT;  Surgeon: Hollice Espy, MD;  Location: [facility] ORS;  Service: Urology;  Laterality: Right;  . Ureteroscopy N/A 07/24/2015   Procedure: URETEROSCOPY;  Surgeon: Hollice Espy, MD;  Location: [facility] ORS;  Service: Urology;  Laterality: N/A;    Home Medications:    Medication List       This list is accurate as of: 08/01/15 11:59 PM.  Always use your most recent med list.               aspirin EC 81 MG tablet  Take 81 mg by mouth every morning.     atorvastatin 40 MG tablet  Commonly known as:  LIPITOR  Take 1 tablet by mouth every morning.     CALCIUM 500 PO  Take 500 mg by mouth 2 (two) times daily.     cephALEXin 250 MG capsule  Commonly known as:  KEFLEX  Take 1 capsule (250 mg total) by mouth every 12 (twelve) hours.     clorazepate 3.75 MG tablet  Commonly known as:  TRANXENE  Take 3.75 mg by mouth every morning.     furosemide 20 MG tablet  Commonly known as:  LASIX  Take 1 tablet by mouth daily.     lisinopril 20 MG tablet  Commonly known as:  PRINIVIL,ZESTRIL  Take 1 tablet by mouth daily.     metoprolol tartrate 25 MG tablet  Commonly known as:  LOPRESSOR  Take 25 mg by mouth 2 (two) times daily.     multivitamin tablet  Take 1 tablet by mouth daily.     oxybutynin 5 MG 24 hr tablet  Commonly known as:  DITROPAN XL  Take 1 tablet (  5 mg total) by mouth 3 (three) times daily as needed.     tamsulosin 0.4 MG Caps capsule  Commonly known as:  FLOMAX  Take 1 capsule (0.4 mg total) by mouth daily.        Allergies:  Allergies  Allergen Reactions  . Amoxicillin Other (See Comments)    Other Reaction: pt not sure if really allergic    Family History: Family History  Problem Relation Age of Onset  . Kidney disease Neg Hx   . Bladder Cancer Neg Hx   . Cancer      Social History:  reports that she has quit smoking. She has never used smokeless tobacco. She reports that she drinks alcohol. She reports that she does not use illicit drugs.  ROS: UROLOGY Frequent Urination?: No Hard to postpone urination?: No Burning/pain with urination?: No Get up at night to urinate?:  No Leakage of urine?: No Urine stream starts and stops?: No Trouble starting stream?: No Do you have to strain to urinate?: No Blood in urine?: No Urinary tract infection?: No Sexually transmitted disease?: No Injury to kidneys or bladder?: No Painful intercourse?: No Weak stream?: No Currently pregnant?: No Vaginal bleeding?: No Last menstrual period?: n  Gastrointestinal Nausea?: No Vomiting?: No Indigestion/heartburn?: No Diarrhea?: No Constipation?: No  Constitutional Fever: No Night sweats?: No Weight loss?: No Fatigue?: No  Skin Skin rash/lesions?: No Itching?: No  Eyes Blurred vision?: No Double vision?: No  Ears/Nose/Throat Sore throat?: No Sinus problems?: No  Hematologic/Lymphatic Swollen glands?: No Easy bruising?: No  Cardiovascular Leg swelling?: No Chest pain?: No  Respiratory Cough?: No Shortness of breath?: No  Endocrine Excessive thirst?: No  Musculoskeletal Back pain?: No Joint pain?: No  Neurological Headaches?: No Dizziness?: No  Psychologic Depression?: No Anxiety?: No  Physical Exam: BP 112/72 mmHg  Pulse 83  Ht 5' 6"  (1.676 m)  Wt 228 lb 1.6 oz (103.465 kg)  BMI 36.83 kg/m2  Constitutional: Well nourished. Alert and oriented, No acute distress. HEENT: [place] AT, moist mucus membranes. Trachea midline, no masses. Cardiovascular: No clubbing, cyanosis, or edema. Respiratory: Normal respiratory effort, no increased work of breathing. GI: Abdomen is soft, non tender, non distended, no abdominal masses. Liver and spleen not palpable.  No hernias appreciated.  Stool sample for occult testing is not indicated.   GU: No CVA tenderness.  No bladder fullness or masses.   Skin: No rashes, bruises or suspicious lesions. Lymph: No cervical or inguinal adenopathy. Neurologic: Grossly intact, no focal deficits, moving all 4 extremities. Psychiatric: Normal mood and affect.  Laboratory Data: Lab Results  Component Value Date     WBC 7.6 07/25/2015   HGB 11.2* 07/25/2015   HCT 33.2* 07/25/2015   MCV 93.8 07/25/2015   PLT 173 07/25/2015    Lab Results  Component Value Date   CREATININE 0.84 07/25/2015    Lab Results  Component Value Date   TSH 1.55 03/04/2014       Component Value Date/Time   CHOL 223* 03/04/2014 0506   HDL 49 03/04/2014 0506   VLDL 33 03/04/2014 0506   LDLCALC 141* 03/04/2014 0506    Lab Results  Component Value Date   AST 34 07/22/2015   Lab Results  Component Value Date   ALT 19 07/22/2015     Urinalysis Results for orders placed or performed in visit on 08/01/15  Microscopic Examination  Result Value Ref Range   WBC, UA 11-30 (A) 0 -  5 /hpf   RBC,  UA 3-10 (A) 0 -  2 /hpf   Epithelial Cells (non renal) >10 (A) 0 - 10 /hpf   Renal Epithel, UA >10 (A) None seen /hpf   Bacteria, UA None seen None seen/Few  Urinalysis, Complete  Result Value Ref Range   Specific Gravity, UA 1.010 1.005 - 1.030   pH, UA 7.0 5.0 - 7.5   Color, UA Straw Yellow   Appearance Ur Cloudy (A) Clear   Leukocytes, UA 1+ (A) Negative   Protein, UA Negative Negative/Trace   Glucose, UA Negative Negative   Ketones, UA Negative Negative   RBC, UA Trace (A) Negative   Bilirubin, UA Negative Negative   Urobilinogen, Ur 0.2 0.2 - 1.0 mg/dL   Nitrite, UA Negative Negative   Microscopic Examination See below:     Pertinent Imaging: CLINICAL DATA: Kidney stones with ureteral stent placement.  EXAM: ABDOMEN - 1 VIEW  COMPARISON: CT scan 07/22/2015  FINDINGS: Supine view the abdomen shows a right double-J internal ureteral stent with proximal loop formed in the expected region of the right renal pelvis and the distal loop overlying the midline lower pelvis, consistent with bladder placement. UVJ stone seen on the previous CT scan is not evident on today's study although there appears to be a new similar size stone in the lower pole the right kidney. Bowel gas pattern is normal.  Degenerative changes are noted in the lumbar spine.  IMPRESSION: Right internal ureteral stent appears appropriately positioned. The UPJ stone seen on the previous CT scan has apparently migrated into the lower pole of the right kidney.   Electronically Signed  By: Misty Stanley M.D.  On: 08/01/2015 09:31  Assessment & Plan:    Patient is status post right emergent ureteral stent placement for a 4 mm stone at the right UPJ causing moderate hydronephrosis and significant right perinephric stranding with associated fever who is scheduled to undergo right  ESWL for definitive stone treatment.  1. Right renal stone:   I discussed treatment options for her stone, such as MET, ESWL and URS/LL/ureteral stent exchange.  I described each options, the success rates and the risks.  She would like to undergo ESWL.  She is advised that ESWL has an 80% success rate.  I explained to her that the ureteral stent would not be removed until the stone fragments are cleared.  I stated the risks of ESWL is pain in the treated area, infection, bleeding and possible contusion of the kidney.  She understands these risks and wishes to proceed with ESWL.  She is restarted on her Keflex.  Culture is pending.   - Urinalysis, Complete - CULTURE, URINE COMPREHENSIVE  2. Right hydronephrosis:   Patient will undergo renal ultrasound when she has undergone definitive treatment for the right lower pole stone and fragments have cleared and the stent has been removed to ensure the hydronephrosis has resolved.  3. Microscopic hematuria:   We will continue to monitor her UA after she has undergone definitive treatment for the renal stone to ensure the microscopic hematuria resolves.  Return for schedule for right ESWL.  These notes generated with voice recognition software. I apologize for typographical errors.  Zara Council, [facility] [address], [place], [place]  [address] [phone]

## 2015-08-02 DIAGNOSIS — M170 Bilateral primary osteoarthritis of knee: Secondary | ICD-10-CM | POA: Insufficient documentation

## 2015-08-02 DIAGNOSIS — E7800 Pure hypercholesterolemia, unspecified: Secondary | ICD-10-CM | POA: Diagnosis not present

## 2015-08-02 DIAGNOSIS — Z0000 Encounter for general adult medical examination without abnormal findings: Secondary | ICD-10-CM | POA: Diagnosis not present

## 2015-08-02 DIAGNOSIS — Z79899 Other long term (current) drug therapy: Secondary | ICD-10-CM | POA: Diagnosis not present

## 2015-08-02 DIAGNOSIS — N200 Calculus of kidney: Secondary | ICD-10-CM | POA: Insufficient documentation

## 2015-08-02 MED ORDER — CEPHALEXIN 250 MG PO CAPS: 250.0000 mg | ORAL_CAPSULE | Freq: Two times a day (BID) | ORAL | Status: DC

## 2015-08-04 LAB — CULTURE, URINE COMPREHENSIVE

## 2015-08-08 ENCOUNTER — Encounter: Payer: Self-pay | Admitting: *Deleted

## 2015-08-09 ENCOUNTER — Encounter
Admission: RE | Disposition: A | Discharge status: Home / Self Care | Payer: Self-pay | Source: Ambulatory Visit | Attending: Urology

## 2015-08-09 ENCOUNTER — Ambulatory Visit
Admission: RE | Admit: 2015-08-09 | Discharge: 2015-08-09 | Disposition: A | Discharge status: Home / Self Care | Payer: Commercial Managed Care - HMO | Source: Ambulatory Visit | Attending: Urology | Admitting: Urology

## 2015-08-09 ENCOUNTER — Ambulatory Visit: Payer: Commercial Managed Care - HMO

## 2015-08-09 ENCOUNTER — Encounter: Payer: Self-pay | Admitting: *Deleted

## 2015-08-09 DIAGNOSIS — I10 Essential (primary) hypertension: Secondary | ICD-10-CM | POA: Diagnosis not present

## 2015-08-09 DIAGNOSIS — N200 Calculus of kidney: Secondary | ICD-10-CM | POA: Diagnosis not present

## 2015-08-09 DIAGNOSIS — Z7901 Long term (current) use of anticoagulants: Secondary | ICD-10-CM | POA: Insufficient documentation

## 2015-08-09 DIAGNOSIS — Z951 Presence of aortocoronary bypass graft: Secondary | ICD-10-CM | POA: Insufficient documentation

## 2015-08-09 DIAGNOSIS — I252 Old myocardial infarction: Secondary | ICD-10-CM | POA: Diagnosis not present

## 2015-08-09 HISTORY — DX: Peripheral vascular disease, unspecified: I73.9

## 2015-08-09 HISTORY — DX: Anxiety disorder, unspecified: F41.9

## 2015-08-09 HISTORY — PX: EXTRACORPOREAL SHOCK WAVE LITHOTRIPSY: SHX1557

## 2015-08-09 SURGERY — LITHOTRIPSY, ESWL
Anesthesia: Moderate Sedation | Laterality: Right

## 2015-08-09 MED ORDER — DIPHENHYDRAMINE HCL 25 MG PO CAPS
ORAL_CAPSULE | ORAL | Status: AC
  Administered 2015-08-09: 25 mg via ORAL
  Filled 2015-08-09: qty 1

## 2015-08-09 MED ORDER — CIPROFLOXACIN HCL 500 MG PO TABS
500.0000 mg | ORAL_TABLET | ORAL | Status: AC
  Administered 2015-08-09: 500 mg via ORAL

## 2015-08-09 MED ORDER — DEXTROSE-NACL 5-0.45 % IV SOLN
INTRAVENOUS | Status: DC
  Administered 2015-08-09: 09:00:00 via INTRAVENOUS

## 2015-08-09 MED ORDER — HYDROCODONE-ACETAMINOPHEN 5-325 MG PO TABS: 1.0000 | ORAL_TABLET | Freq: Four times a day (QID) | ORAL | Status: DC | PRN

## 2015-08-09 MED ORDER — DIPHENHYDRAMINE HCL 25 MG PO CAPS
25.0000 mg | ORAL_CAPSULE | ORAL | Status: AC
  Administered 2015-08-09: 25 mg via ORAL

## 2015-08-09 MED ORDER — CIPROFLOXACIN HCL 500 MG PO TABS
ORAL_TABLET | ORAL | Status: AC
  Filled 2015-08-09: qty 1

## 2015-08-09 MED ORDER — DOCUSATE SODIUM 100 MG PO CAPS: 100.0000 mg | ORAL_CAPSULE | Freq: Two times a day (BID) | ORAL | Status: AC

## 2015-08-09 MED ORDER — DIAZEPAM 5 MG PO TABS
10.0000 mg | ORAL_TABLET | ORAL | Status: AC
  Administered 2015-08-09: 10 mg via ORAL

## 2015-08-09 MED ORDER — DIAZEPAM 5 MG PO TABS
ORAL_TABLET | ORAL | Status: AC
  Administered 2015-08-09: 10 mg via ORAL
  Filled 2015-08-09: qty 2

## 2015-08-09 MED ORDER — CIPROFLOXACIN HCL 500 MG PO TABS
ORAL_TABLET | ORAL | Status: AC
  Administered 2015-08-09: 500 mg via ORAL
  Filled 2015-08-09: qty 1

## 2015-08-09 NOTE — Discharge Instructions (Signed)
See [facility] discharge instructions in chart.  

## 2015-08-09 NOTE — H&P (Signed)
See paper H&P 

## 2015-08-24 ENCOUNTER — Encounter: Payer: Self-pay | Admitting: Urology

## 2015-08-24 ENCOUNTER — Ambulatory Visit (INDEPENDENT_AMBULATORY_CARE_PROVIDER_SITE_OTHER): Payer: Commercial Managed Care - HMO | Admitting: Urology

## 2015-08-24 ENCOUNTER — Ambulatory Visit
Admission: RE | Admit: 2015-08-24 | Discharge: 2015-08-24 | Disposition: A | Discharge status: Home / Self Care | Payer: Commercial Managed Care - HMO | Source: Ambulatory Visit | Attending: Urology | Admitting: Urology

## 2015-08-24 VITALS — BP 132/82 | HR 105 | Ht 64.0 in | Wt 222.0 lb

## 2015-08-24 DIAGNOSIS — N200 Calculus of kidney: Secondary | ICD-10-CM | POA: Insufficient documentation

## 2015-08-24 DIAGNOSIS — N3001 Acute cystitis with hematuria: Secondary | ICD-10-CM | POA: Diagnosis not present

## 2015-08-24 LAB — URINALYSIS, COMPLETE
Bilirubin, UA: NEGATIVE
Glucose, UA: NEGATIVE
Ketones, UA: NEGATIVE
Nitrite, UA: NEGATIVE
Specific Gravity, UA: 1.015 (ref 1.005–1.030)
Urobilinogen, Ur: 0.2 mg/dL (ref 0.2–1.0)
pH, UA: 5.5 (ref 5.0–7.5)

## 2015-08-24 LAB — MICROSCOPIC EXAMINATION
Epithelial Cells (non renal): >10 /hpf — ABNORMAL HIGH (ref 0–10)
Renal Epithel, UA: >10 /hpf
WBC, UA: >30 /hpf — ABNORMAL HIGH (ref 0–?)

## 2015-08-24 MED ORDER — LIDOCAINE HCL 2 % EX GEL
1.0000 "application " | Freq: Once | CUTANEOUS | Status: AC
  Administered 2015-08-24: 1 via URETHRAL

## 2015-08-24 MED ORDER — CIPROFLOXACIN HCL 500 MG PO TABS
500.0000 mg | ORAL_TABLET | Freq: Once | ORAL | Status: AC
  Administered 2015-08-24: 500 mg via ORAL

## 2015-08-24 NOTE — Progress Notes (Signed)
08/24/2015  HPI: 70 year old female with a 4 mm right UPJ stone status post right ureteral stent placement urgently. Follow-up imaging revealed the stone in her right lower pole. She underwent right ESWL on 08/09/2015 and returns today for cystoscopy, stent removal. She's done well following this procedure as no complaints. She is not passing any stone fragments that she is aware of.  Blood pressure 132/82, pulse 105, height 5\' 4"  (1.626 m), weight 222 lb (100.699 kg). NAD.  A&Ox3 Abd soft, obese, NT Normal urethral meatus  Cystoscopy/ Stent removal procedure  Patient identification was confirmed, informed consent was obtained, and patient was prepped using Betadine solution.  Lidocaine jelly was administered per urethral meatus.    Preoperative abx where received prior to procedure.    Procedure: - Flexible cystoscope introduced, without any difficulty.   - Thorough search of the bladder revealed:    normal urethral meatus  Stent seen emanating from right ureteral orifice, grasped with stent graspers, and removed in entirety.    Post-Procedure: - Patient tolerated the procedure well  Assessment Right kidney stone status post right ESWL Cystitis   Plan: Warning symptoms reviewed KUB today to assess efficacy of ESWL Return to 4 weeks of renal stone prior to assess for resolution of hydronephrosis UA today consistent with stent, we will send urine culture to rule out infection  Return in about 4 weeks (around 09/21/2015) for KUB today, f/u in 4 weeks for RUS.   Vanna ScotlandAshley Drea Jurewicz, MD

## 2015-08-27 LAB — CULTURE, URINE COMPREHENSIVE

## 2015-09-03 ENCOUNTER — Ambulatory Visit
Admission: RE | Admit: 2015-09-03 | Discharge: 2015-09-03 | Disposition: A | Discharge status: Home / Self Care | Payer: Commercial Managed Care - HMO | Source: Ambulatory Visit | Attending: Urology | Admitting: Urology

## 2015-09-03 DIAGNOSIS — N200 Calculus of kidney: Secondary | ICD-10-CM | POA: Diagnosis not present

## 2015-09-21 ENCOUNTER — Ambulatory Visit: Payer: Commercial Managed Care - HMO | Admitting: Urology

## 2015-09-26 ENCOUNTER — Telehealth: Payer: Self-pay | Admitting: Urology

## 2015-09-26 DIAGNOSIS — N281 Cyst of kidney, acquired: Secondary | ICD-10-CM

## 2015-09-26 NOTE — Telephone Encounter (Signed)
This patient was supposed to be seen in the office for follow-up but does not appear that this was scheduled. Please let her know that on her ultrasound, there was no evidence of residual stones or swelling. The ultrasound did show a very small lesion in the kidney which is likely a cysts but follow-up with a renal ultrasound in 6 months was recommended. This is probably a good idea. We can also discussed prevention techniques at that time but encourage adequate hydration in the meantime.   I would like her to come back in 6 months with a renal ultrasound.  Vanna ScotlandAshley Baran Kuhrt, MD

## 2015-09-26 NOTE — Telephone Encounter (Signed)
She had a follow up with you on the 21st of this month but called and cx it because she was sick. She said she would call back later to reschd it but has not done so yet. That's why you didn't see it.  I will take care of it    Thanks,  Molly Esparza

## 2016-02-05 DIAGNOSIS — E7800 Pure hypercholesterolemia, unspecified: Secondary | ICD-10-CM | POA: Diagnosis not present

## 2016-02-05 DIAGNOSIS — I739 Peripheral vascular disease, unspecified: Secondary | ICD-10-CM | POA: Diagnosis not present

## 2016-02-05 DIAGNOSIS — Z79899 Other long term (current) drug therapy: Secondary | ICD-10-CM | POA: Diagnosis not present

## 2016-02-05 DIAGNOSIS — I10 Essential (primary) hypertension: Secondary | ICD-10-CM | POA: Diagnosis not present

## 2016-02-05 DIAGNOSIS — I2510 Atherosclerotic heart disease of native coronary artery without angina pectoris: Secondary | ICD-10-CM | POA: Diagnosis not present

## 2016-03-28 ENCOUNTER — Ambulatory Visit: Payer: Commercial Managed Care - HMO | Admitting: Urology

## 2016-07-28 DIAGNOSIS — E7800 Pure hypercholesterolemia, unspecified: Secondary | ICD-10-CM | POA: Diagnosis not present

## 2016-07-28 DIAGNOSIS — Z79899 Other long term (current) drug therapy: Secondary | ICD-10-CM | POA: Diagnosis not present

## 2016-08-04 DIAGNOSIS — Z0000 Encounter for general adult medical examination without abnormal findings: Secondary | ICD-10-CM | POA: Diagnosis not present

## 2016-12-29 ENCOUNTER — Emergency Department: Payer: Medicare HMO

## 2016-12-29 ENCOUNTER — Emergency Department
Admission: EM | Admit: 2016-12-29 | Discharge: 2016-12-29 | Disposition: A | Discharge status: Home / Self Care | Payer: Medicare HMO | Attending: Emergency Medicine | Admitting: Emergency Medicine

## 2016-12-29 DIAGNOSIS — I509 Heart failure, unspecified: Secondary | ICD-10-CM | POA: Diagnosis not present

## 2016-12-29 DIAGNOSIS — R0602 Shortness of breath: Secondary | ICD-10-CM | POA: Diagnosis not present

## 2016-12-29 DIAGNOSIS — Z7982 Long term (current) use of aspirin: Secondary | ICD-10-CM | POA: Diagnosis not present

## 2016-12-29 DIAGNOSIS — R2241 Localized swelling, mass and lump, right lower limb: Secondary | ICD-10-CM | POA: Diagnosis not present

## 2016-12-29 DIAGNOSIS — I110 Hypertensive heart disease with heart failure: Secondary | ICD-10-CM | POA: Insufficient documentation

## 2016-12-29 DIAGNOSIS — I739 Peripheral vascular disease, unspecified: Secondary | ICD-10-CM | POA: Diagnosis not present

## 2016-12-29 DIAGNOSIS — I25810 Atherosclerosis of coronary artery bypass graft(s) without angina pectoris: Secondary | ICD-10-CM | POA: Insufficient documentation

## 2016-12-29 DIAGNOSIS — R0600 Dyspnea, unspecified: Secondary | ICD-10-CM | POA: Insufficient documentation

## 2016-12-29 DIAGNOSIS — Z87891 Personal history of nicotine dependence: Secondary | ICD-10-CM | POA: Insufficient documentation

## 2016-12-29 DIAGNOSIS — Z79899 Other long term (current) drug therapy: Secondary | ICD-10-CM | POA: Insufficient documentation

## 2016-12-29 LAB — BASIC METABOLIC PANEL
ANION GAP: 8 (ref 5–15)
BUN: 12 mg/dL (ref 6–20)
CALCIUM: 9.4 mg/dL (ref 8.9–10.3)
CHLORIDE: 109 mmol/L (ref 101–111)
CO2: 24 mmol/L (ref 22–32)
Creatinine, Ser: 0.89 mg/dL (ref 0.44–1.00)
GFR calc non Af Amer: >60 mL/min (ref >60)
GLUCOSE: 158 mg/dL — AB (ref 65–99)
POTASSIUM: 3.7 mmol/L (ref 3.5–5.1)
Sodium: 141 mmol/L (ref 135–145)

## 2016-12-29 LAB — TROPONIN I

## 2016-12-29 LAB — CBC
HEMATOCRIT: 39 % (ref 35.0–47.0)
HEMOGLOBIN: 13.1 g/dL (ref 12.0–16.0)
MCH: 31.7 pg (ref 26.0–34.0)
MCHC: 33.7 g/dL (ref 32.0–36.0)
MCV: 94.3 fL (ref 80.0–100.0)
Platelets: 342 10*3/uL (ref 150–440)
RBC: 4.14 MIL/uL (ref 3.80–5.20)
RDW: 12.9 % (ref 11.5–14.5)
WBC: 7.6 10*3/uL (ref 3.6–11.0)

## 2016-12-29 NOTE — ED Triage Notes (Signed)
[facility]. Pt reports SHOB x4 weeks with worsening over the last 4 hrs. Pt reports RIGHT ankle swelling x 2-3 days. Pt does not use home O2 and lives at home alone. Of note, pt had a quadruple bypass within the last 5 yrs. EMS reported VS: HR 98, O2 97% 2L [place].

## 2016-12-29 NOTE — ED Provider Notes (Signed)
[facility] Emergency Department Provider Note  Time seen: 9:42 PM  I have reviewed the triage vital signs and the nursing notes.   HISTORY  Chief Complaint Shortness of Breath and Joint Swelling    HPI Molly Esparza is a 71 y.o. female with a past medical history of hypertension, hyperlipidemia, CHF, presents to the emergency department for shortness of breath. According to the patient for the past 5 days he has been feeling progressively more short of breath. States it is worse with exertion. Denies any chest pain at any point. States mild swelling of her right leg which is where she had the vein harvested states she always has swelling to this leg. Denies any leg pain. Denies any fever, cough or congestion.  Past Medical History:  Diagnosis Date  . Anxiety   . Coronary atherosclerosis of native coronary artery   . Generalized anxiety disorder   . HLD (hyperlipidemia)   . Hypertension   . Kidney stone   . Peripheral vascular disease (HCC)   . PVD (peripheral vascular disease) [facility](HCC)     Patient Active Problem List   Diagnosis Date Noted  . Calculus of kidney 08/02/2015  . Primary osteoarthritis of both knees 08/02/2015  . Kidney stones 08/01/2015  . Hydronephrosis with urinary obstruction due to ureteral calculus 08/01/2015  . Microscopic hematuria 08/01/2015  . Pyelonephritis   . Right flank pain   . Kidney stone 07/22/2015  . Anxiety, generalized 11/29/2013  . Peripheral vascular disease (HCC) 11/16/2013  . 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 Benign essential HTN 09/09/2013  . HLD (hyperlipidemia) 09/09/2013  . UNSPECIFIED ESSENTIAL HYPERTENSION 06/17/2010  . CORONARY ATHEROSCLEROSIS NATIVE CORONARY ARTERY 06/17/2010    Past Surgical History:  Procedure Laterality Date  . CORONARY ARTERY BYPASS GRAFT    . CYSTOSCOPY WITH STENT PLACEMENT Right 07/24/2015   Procedure: CYSTOSCOPY WITH STENT PLACEMENT;   Surgeon: Vanna ScotlandAshley Brandon, MD;  Location: [facility] ORS;  Service: Urology;  Laterality: Right;  . EXTRACORPOREAL SHOCK WAVE LITHOTRIPSY Right 08/09/2015   Procedure: EXTRACORPOREAL SHOCK WAVE LITHOTRIPSY (ESWL);  Surgeon: Vanna ScotlandAshley Brandon, MD;  Location: [facility] ORS;  Service: Urology;  Laterality: Right;  . URETEROSCOPY N/A 07/24/2015   Procedure: URETEROSCOPY;  Surgeon: Vanna ScotlandAshley Brandon, MD;  Location: [facility] ORS;  Service: Urology;  Laterality: N/A;    Prior to Admission medications   Medication Sig Start Date End Date Taking? Authorizing Provider  aspirin EC 81 MG tablet Take 81 mg by mouth every morning.    [provider]  atorvastatin (LIPITOR) 40 MG tablet Take 1 tablet by mouth every morning.  07/03/15   [provider]  Calcium-Magnesium-Vitamin D (CALCIUM 500 PO) Take 500 mg by mouth 2 (two) times daily.    [provider]  clorazepate (TRANXENE) 3.75 MG tablet Take 3.75 mg by mouth every morning.    [provider]  docusate sodium (COLACE) 100 MG capsule Take 1 capsule (100 mg total) by mouth 2 (two) times daily. 08/09/15   Vanna ScotlandBrandon, Ashley, MD  furosemide (LASIX) 20 MG tablet Take 1 tablet by mouth daily. 07/03/15   [provider]  lisinopril (PRINIVIL,ZESTRIL) 20 MG tablet Take 1 tablet by mouth daily. 07/03/15   [provider]  metoprolol tartrate (LOPRESSOR) 25 MG tablet Take 25 mg by mouth 2 (two) times daily.    [provider]  Multiple Vitamin (MULTIVITAMIN) tablet Take 1 tablet by mouth daily.    [provider]  Allergies  Allergen Reactions  . Amoxicillin Other (See Comments)    Other Reaction: pt not sure if really allergic    Family History  Problem Relation Age of Onset  . Cancer Unknown   . Kidney disease Neg Hx   . Bladder Cancer Neg Hx     Social History Social History  Substance Use Topics  . Smoking status: Former Games developer  . Smokeless tobacco: Never Used     Comment: quit 20 years  . Alcohol use  0.0 oz/week     Comment: occ    Review of Systems Constitutional: Negative for fever Cardiovascular: Negative for chest pain. Respiratory: Negative for shortness of breath.Negative cough Gastrointestinal: Negative for abdominal pain Musculoskeletal: Negative for back pain Neurological: Negative for headache All other ROS negative  ____________________________________________   PHYSICAL EXAM:  VITAL SIGNS: ED Triage Vitals  Enc Vitals Group     BP 12/29/16 2105 (!) 166/117     Pulse Rate 12/29/16 2105 85     Resp 12/29/16 2105 (!) 22     Temp 12/29/16 2105 97.9 F (36.6 C)     Temp Source 12/29/16 2105 Oral     SpO2 12/29/16 2105 95 %     Weight 12/29/16 2059 175 lb (79.4 kg)     Height 12/29/16 2059 5\' 4"  (1.626 m)     Head Circumference --      Peak Flow --      Pain Score 12/29/16 2059 0     Pain Loc --      Pain Edu? --      Excl. in GC? --     Constitutional: Alert and oriented. Well appearing and in no distress. Eyes: Normal exam ENT   Head: Normocephalic and atraumatic   Mouth/Throat: Mucous membranes are moist. Cardiovascular: Normal rate, regular rhythm. No murmur Respiratory: Normal respiratory effort without tachypnea nor retractions. Breath sounds are clear Gastrointestinal: Soft and nontender. No distention.   Musculoskeletal: Nontender with normal range of motion in all extremities. No lower extremity tenderness. Mild edema of the right lower extremity. 2+ DP pulses bilaterally. Neurologic:  Normal speech and language. No gross focal neurologic deficits Skin:  Skin is warm, dry and intact.  Psychiatric: Mood and affect are normal.   ____________________________________________    EKG  EKG reviewed and interpreted by myself shows normal sinus rhythm at 82 bpm, narrow QRS, normal axis, normal intervals, no concerning ST changes.  ____________________________________________    RADIOLOGY  Chest x-ray shows mild alveolar  edema.  ____________________________________________   INITIAL IMPRESSION / ASSESSMENT AND PLAN / ED COURSE  Pertinent labs & imaging results that were available during my care of the patient were reviewed by me and considered in my medical decision making (see chart for details).  Patient presents to the emergency department for shortness of breath for the past 5 days progressively worsening. Patient has clear lung sounds on exam. Normal labs including a negative troponin. Chest x-ray shows minimal alveolar edema otherwise negative. EKG is reassuring. Patient's vitals are reassuring 96% on room air currently during my evaluation. We will ambulate the patient to assess pulse oximeter under exertion.  Patient ambulates well with pulse oximetry 97 and 98% while ambulating. Given the mild alveolar edema we'll increase the patient's Lasix from 20 mg daily to 40 mg daily for the next 5 days and have the patient follow-up with her cardiologist. Patient agreeable to this plan. I discussed my normal dyspnea return precautions.  ____________________________________________   FINAL  CLINICAL IMPRESSION(S) / ED DIAGNOSES  Dyspnea Congestive heart failure    Minna AntisPaduchowski, Chiante Peden, MD 12/29/16 2234

## 2016-12-29 NOTE — ED Notes (Addendum)
Pt states that she cant' breath or get any air in. Has gradually been getting worse for four days. Had bypass surgery about 5 years ago. Hx of Congestive heart failure. States that breathing gets better with positioning.

## 2016-12-29 NOTE — ED Notes (Signed)
Ambulated pt around room. O2 sat stayed in high 90's.

## 2016-12-29 NOTE — Discharge Instructions (Signed)
As we discussed please increase her Lasix from 20 mg daily to 40 mg daily for the next 5 days. Please follow-up with your primary care doctor as well as her cardiologist this week for recheck/reevaluation. Return to the emergency department for any worsening shortness of breath or development of any chest pain.

## 2017-03-02 DEATH — deceased

## 2017-11-21 IMAGING — CT CT RENAL STONE PROTOCOL
1 of 2 series · 15 of 32 positions shown, 19 images · non-contrast
Comparison: None.

CLINICAL DATA: Pt states right sided pain started yesterday. Pt
states the pain today has increased. Pt denies hx of surgery to
abd/pelvis.

EXAM:
CT ABDOMEN AND PELVIS WITHOUT CONTRAST
TECHNIQUE: Multidetector CT imaging of the abdomen and pelvis was performed
following the standard protocol without IV contrast.

[Series 2: stone standard full · axial · 0.82mm/px · z∈[-1004,-564]mm · 15 of 98 slices shown, 19 images]
[im 5/98  soft-tissue]
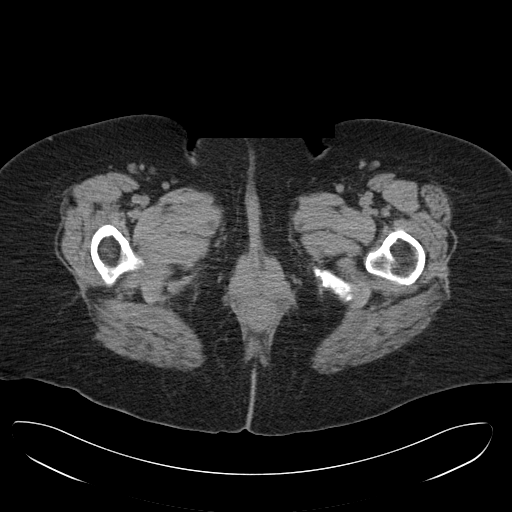
[im 5/98  bone]
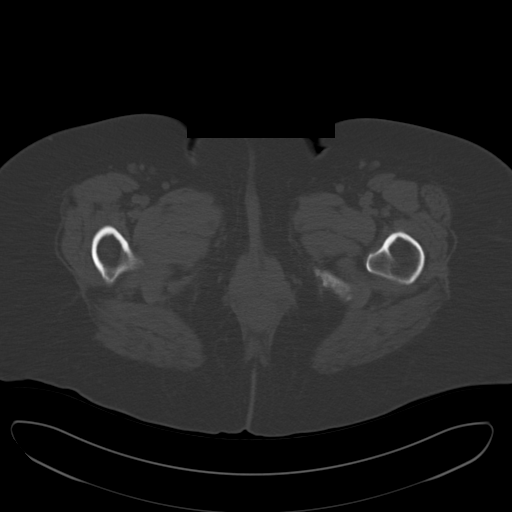
[im 13/98  soft-tissue]
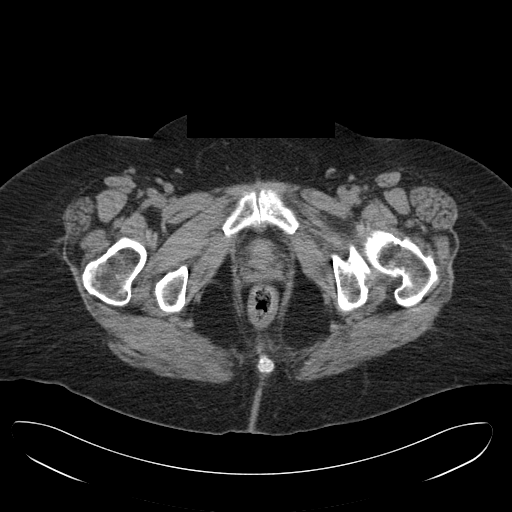
[im 22/98  soft-tissue]
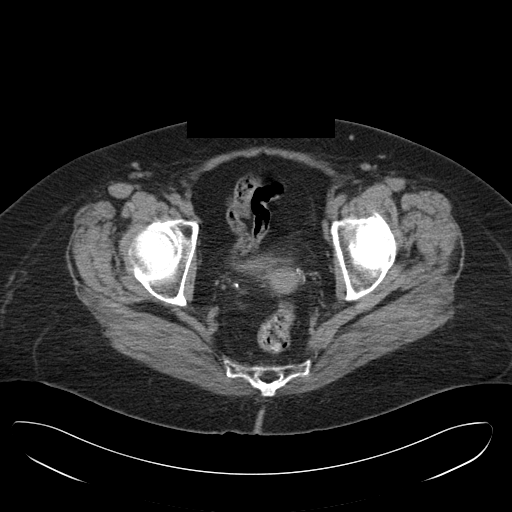
[im 26/98  soft-tissue]
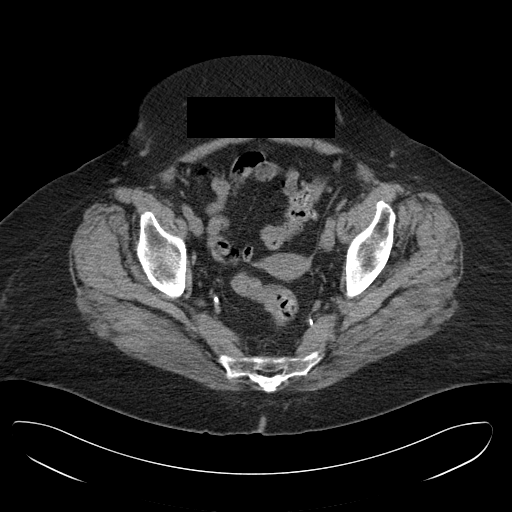
[im 34/98  soft-tissue]
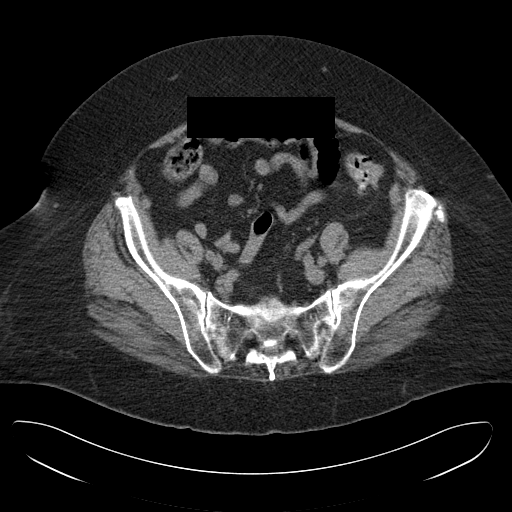
[im 43/98  soft-tissue]
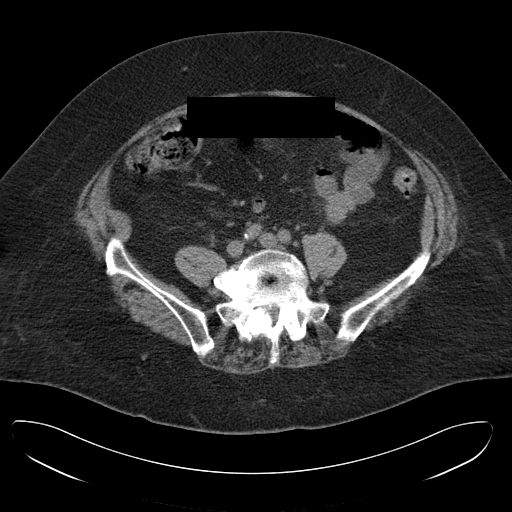
[im 51/98  soft-tissue]
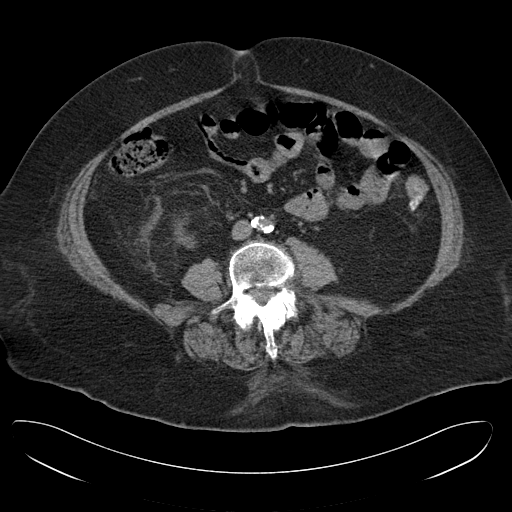
[im 55/98  soft-tissue]
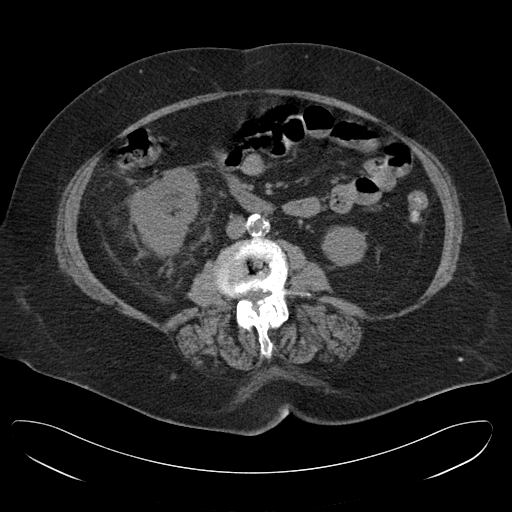
[im 64/98  soft-tissue]
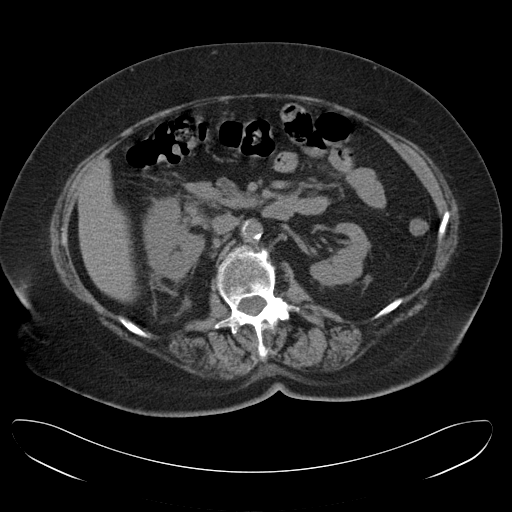
[im 64/98  bone]
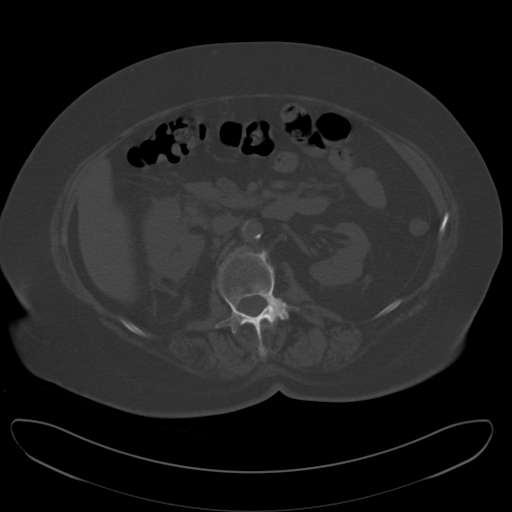
[im 72/98  soft-tissue]
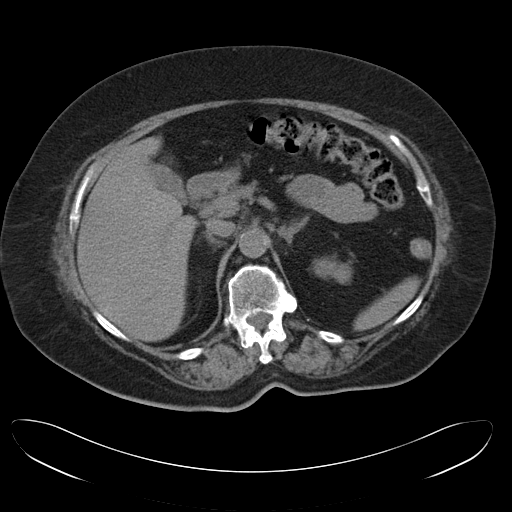
[im 76/98  soft-tissue]
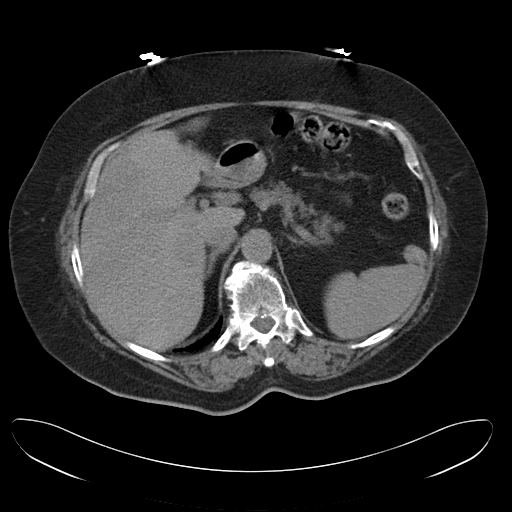
[im 81/98  lung]
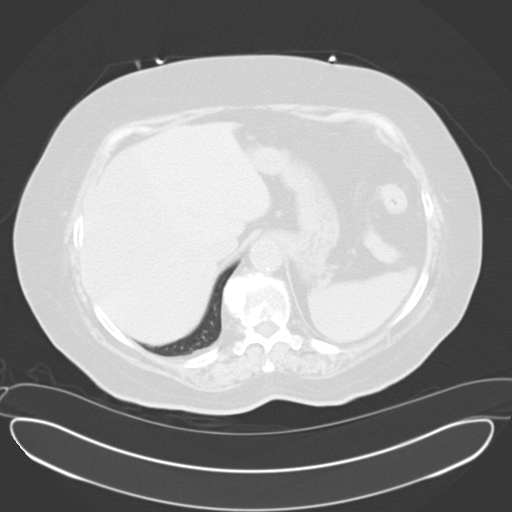
[im 85/98  soft-tissue]
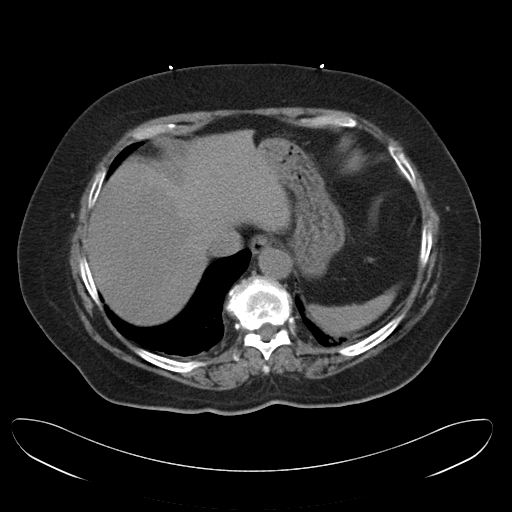
[im 85/98  lung]
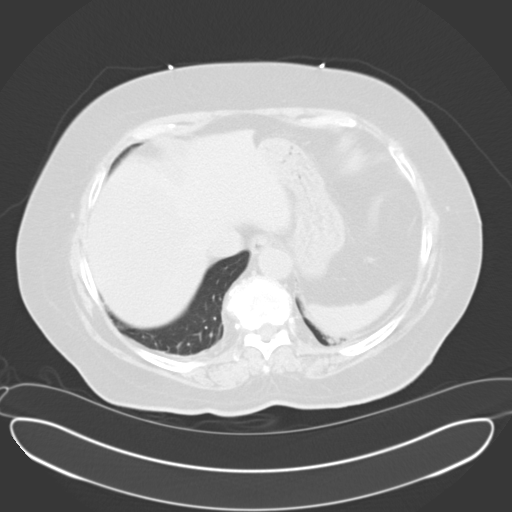
[im 89/98  lung]
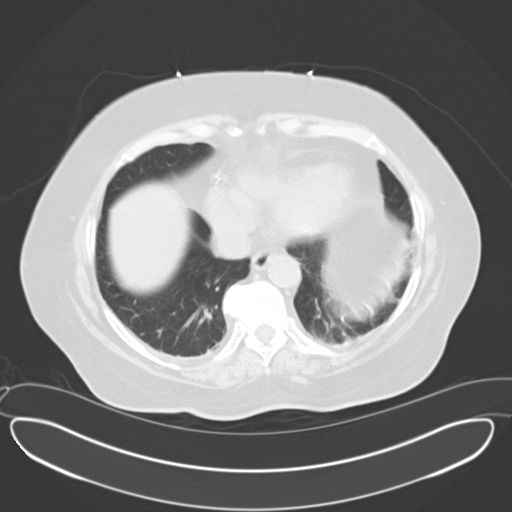
[im 93/98  soft-tissue]
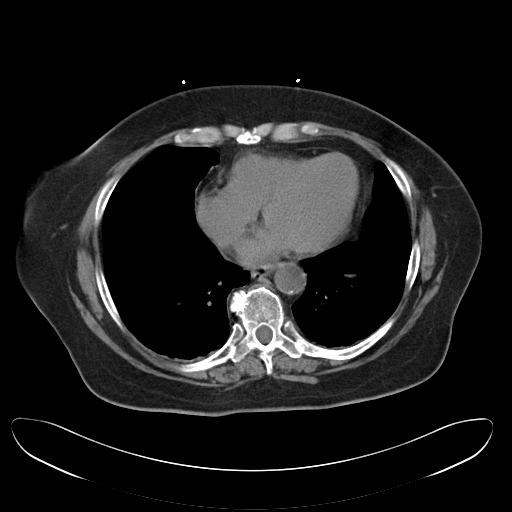
[im 93/98  lung]
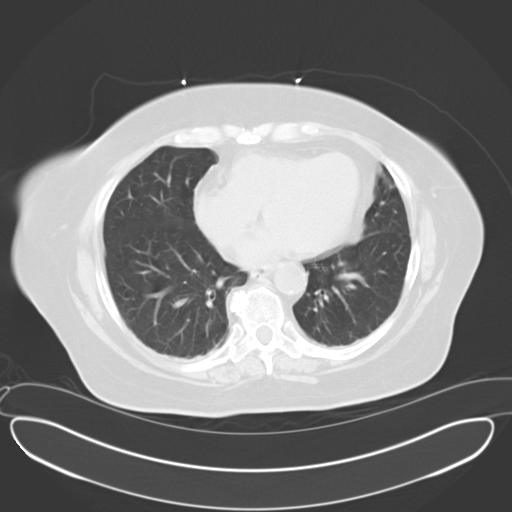

[15 of 32 positions shown; findings below may reference images not displayed]

FINDINGS: Lung bases: Mild left basilar atelectasis. Otherwise clear. Heart
normal size. Status post median sternotomy.

Liver:  Heterogeneous fatty infiltration.  No mass or focal lesion.

Spleen, gallbladder, pancreas, adrenal glands: Normal.

Kidneys, ureters, bladder: 4 mm stone at the right ureteropelvic
junction causes moderate right hydronephrosis and significant right
perinephric stranding and right renal edema. No right renal mass or
intrarenal stones. There is left renal cortical thinning with no
masses or stones. Normal left renal collecting system ureter.
Bladder is unremarkable.

Uterus and adnexa:  Normal.

Lymph nodes:  No adenopathy.

Ascites:  None.

Vascular: Dense atherosclerotic calcifications noted along the
abdominal aorta. No aneurysm.

Gastrointestinal: Colonic diverticula. No diverticulitis. Colon
otherwise unremarkable. Normal stomach and small bowel. Normal
appendix.

Musculoskeletal: Degenerative changes throughout the visualized
spine. Degenerative anterolisthesis of L4 on L5. No osteoblastic or
osteolytic lesions.
IMPRESSION: 1. 4 mm stone at the right ureteropelvic junction causes moderate
right hydronephrosis and significant right perinephric stranding.
2. No other acute findings.  No intrarenal stones.
3. Hepatic steatosis.
